# Patient Record
Sex: Female | Born: 1985 | Race: White | Hispanic: No | Marital: Married | Smoking: Never smoker
Health system: Southern US, Community
[De-identification: ages and names within clinical notes are randomized; demographics above are authoritative.]

## PROBLEM LIST (undated history)

## (undated) DIAGNOSIS — G43909 Migraine, unspecified, not intractable, without status migrainosus: Secondary | ICD-10-CM

## (undated) DIAGNOSIS — J45909 Unspecified asthma, uncomplicated: Secondary | ICD-10-CM

## (undated) DIAGNOSIS — D649 Anemia, unspecified: Secondary | ICD-10-CM

## (undated) DIAGNOSIS — H269 Unspecified cataract: Secondary | ICD-10-CM

## (undated) DIAGNOSIS — R87629 Unspecified abnormal cytological findings in specimens from vagina: Secondary | ICD-10-CM

## (undated) DIAGNOSIS — O139 Gestational [pregnancy-induced] hypertension without significant proteinuria, unspecified trimester: Secondary | ICD-10-CM

## (undated) HISTORY — PX: WISDOM TOOTH EXTRACTION: SHX21

## (undated) HISTORY — PX: CHOLECYSTECTOMY: SHX55

## (undated) HISTORY — DX: Unspecified abnormal cytological findings in specimens from vagina: R87.629

## (undated) HISTORY — DX: Unspecified cataract: H26.9

## (undated) HISTORY — DX: Unspecified asthma, uncomplicated: J45.909

## (undated) HISTORY — DX: Gestational (pregnancy-induced) hypertension without significant proteinuria, unspecified trimester: O13.9

## (undated) HISTORY — DX: Anemia, unspecified: D64.9

## (undated) HISTORY — DX: Migraine, unspecified, not intractable, without status migrainosus: G43.909

---
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
## 2007-09-10 ENCOUNTER — Emergency Department: Payer: Self-pay | Admitting: Emergency Medicine

## 2007-11-02 IMAGING — CR DG ANKLE COMPLETE 3+V*L*
1 series · 5 of 5 positions shown · non-contrast
Comparison: none

REASON FOR EXAM: Injury    [REDACTED]
COMMENTS:

PROCEDURE:     DXR - DXR ANKLE LEFT COMPLETE  - April 08, 2006  [DATE]
RESULT:       Five views of the LEFT ankle were obtained.  No fracture,
dislocation or other acute bony abnormality is identified.  The ankle
mortise is well maintained.

[Series 1: view not recorded · 0.17mm/px · 5 of 5 slices shown]
[im 1/5]
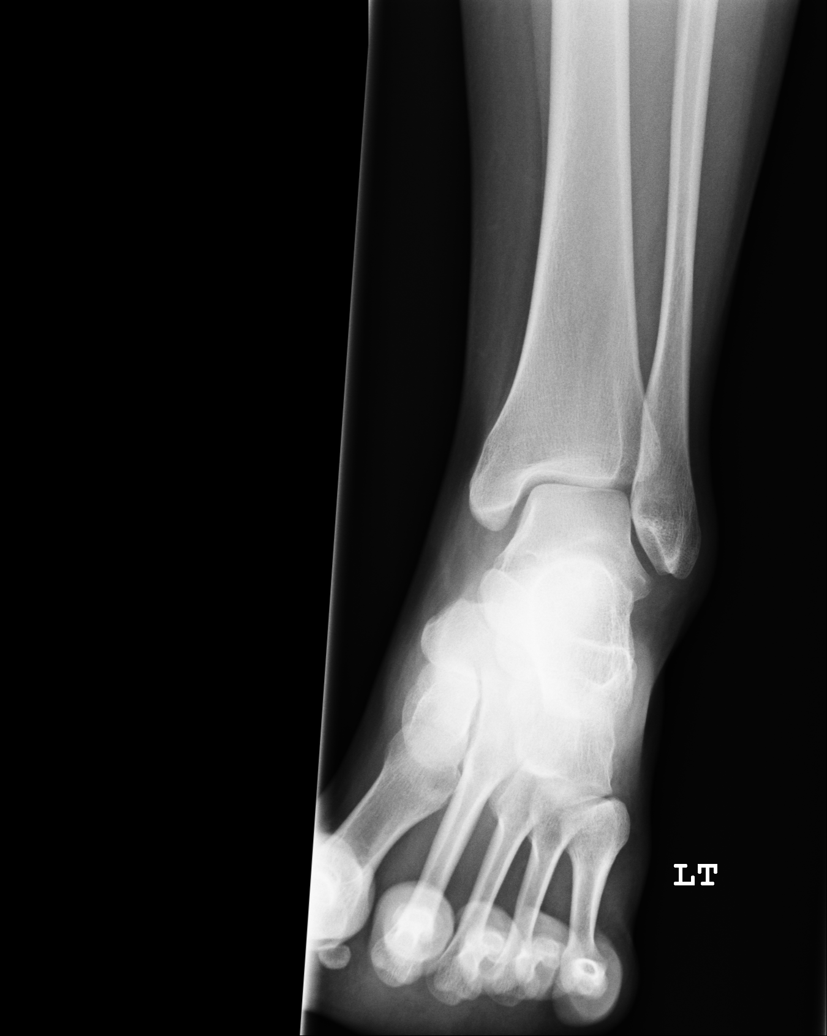
[im 2/5]
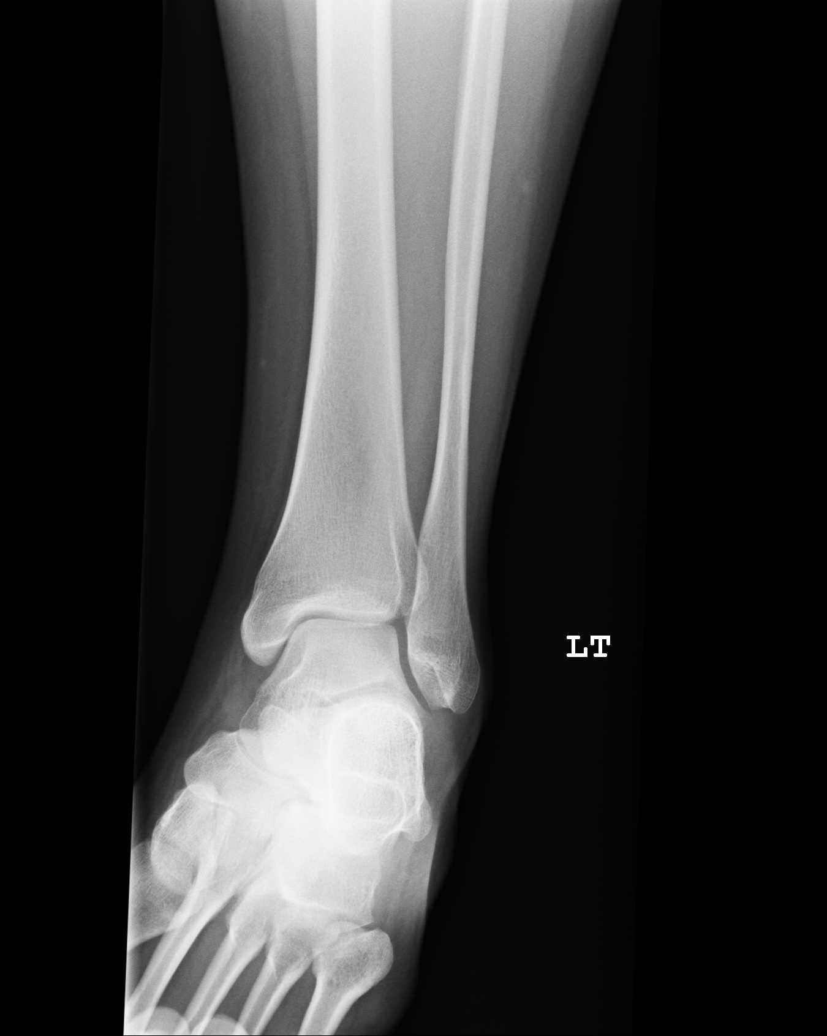
[im 3/5]
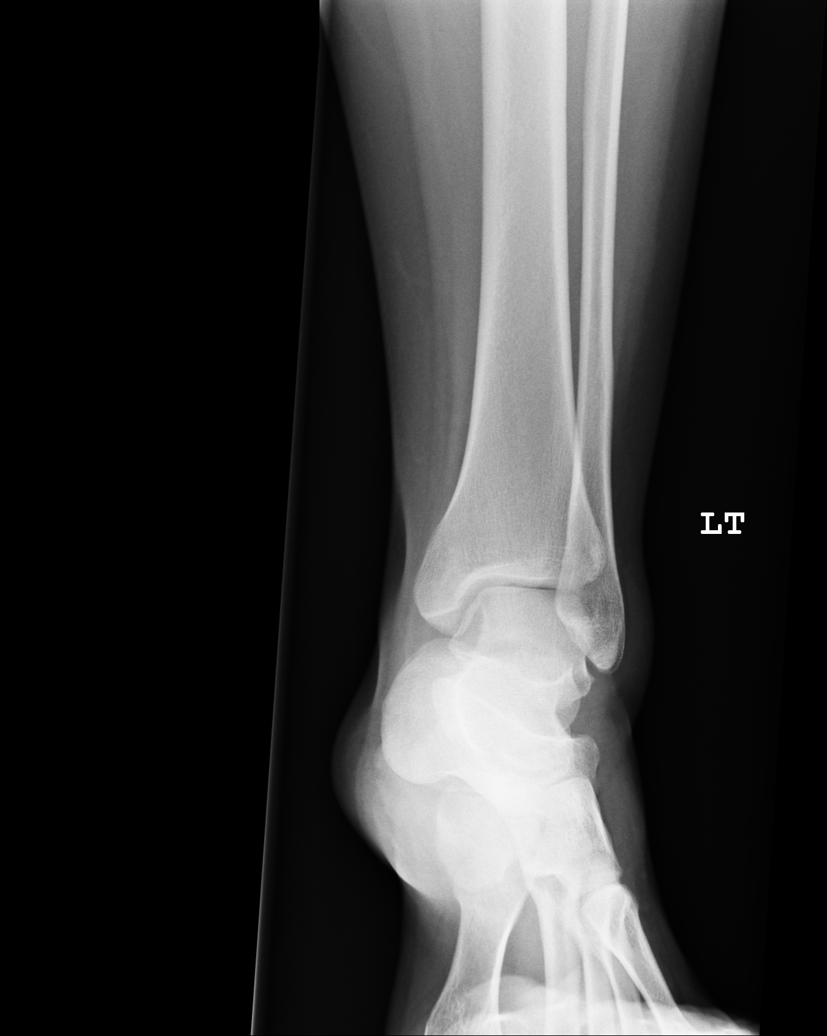
[im 4/5]
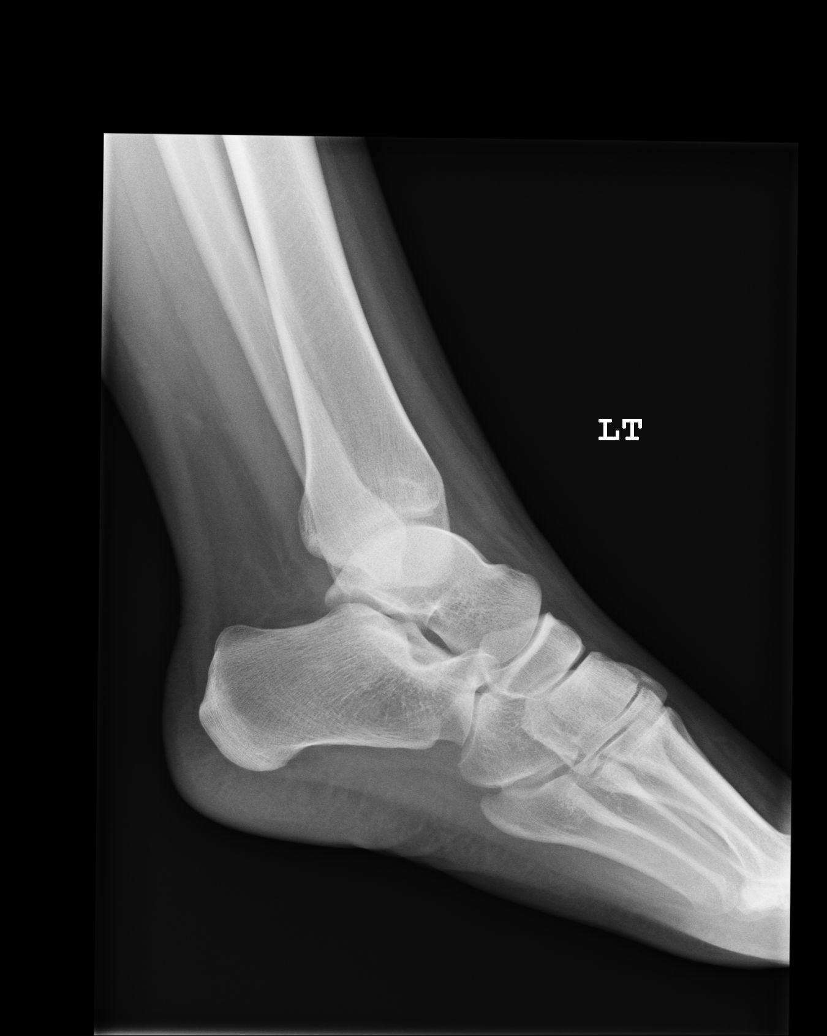
[im 5/5]
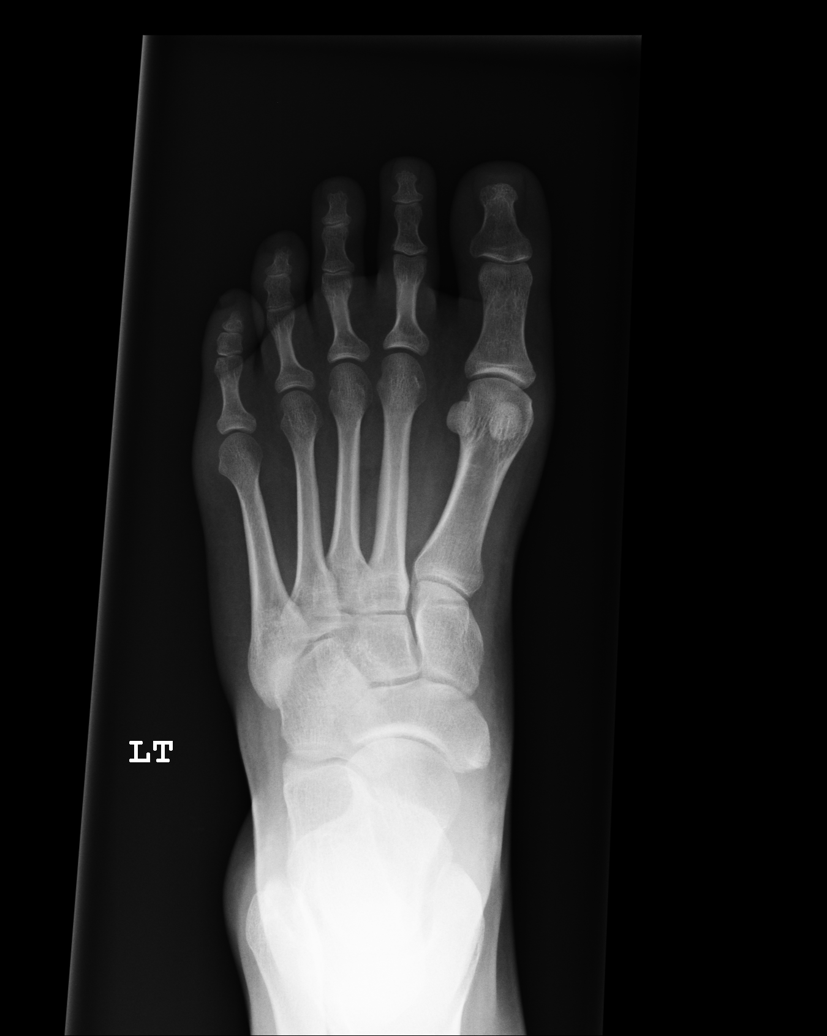

[5 of 5 positions shown; findings below may reference images not displayed]

IMPRESSION: No significant osseous abnormalities are noted.

## 2008-04-13 ENCOUNTER — Emergency Department: Payer: Self-pay | Admitting: Emergency Medicine

## 2008-08-04 ENCOUNTER — Ambulatory Visit: Payer: Self-pay

## 2008-08-28 DIAGNOSIS — R87629 Unspecified abnormal cytological findings in specimens from vagina: Secondary | ICD-10-CM

## 2008-08-28 HISTORY — DX: Unspecified abnormal cytological findings in specimens from vagina: R87.629

## 2009-11-01 ENCOUNTER — Emergency Department: Payer: Self-pay | Admitting: Emergency Medicine

## 2010-09-07 ENCOUNTER — Emergency Department: Payer: Self-pay | Admitting: Emergency Medicine

## 2013-07-02 ENCOUNTER — Emergency Department: Payer: Self-pay | Admitting: Emergency Medicine

## 2013-07-02 LAB — COMPREHENSIVE METABOLIC PANEL
Albumin: 3.7 g/dL (ref 3.4–5.0)
Anion Gap: 2 — ABNORMAL LOW (ref 7–16)
Co2: 30 mmol/L (ref 21–32)
Creatinine: 0.78 mg/dL (ref 0.60–1.30)
EGFR (African American): >60
Glucose: 93 mg/dL (ref 65–99)
Osmolality: 278 (ref 275–301)
SGOT(AST): 18 U/L (ref 15–37)
SGPT (ALT): 15 U/L (ref 12–78)
Sodium: 140 mmol/L (ref 136–145)
Total Protein: 7.2 g/dL (ref 6.4–8.2)

## 2013-07-02 LAB — URINALYSIS, COMPLETE
Bacteria: NONE SEEN
Bilirubin,UR: NEGATIVE
Blood: NEGATIVE
Glucose,UR: NEGATIVE mg/dL (ref 0–75)
Leukocyte Esterase: NEGATIVE
Ph: 5 (ref 4.5–8.0)
Protein: NEGATIVE
RBC,UR: <1 /HPF (ref 0–5)
Specific Gravity: 1.026 (ref 1.003–1.030)

## 2013-07-02 LAB — CBC
MCH: 23.1 pg — ABNORMAL LOW (ref 26.0–34.0)
MCHC: 32.5 g/dL (ref 32.0–36.0)
MCV: 71 fL — ABNORMAL LOW (ref 80–100)
RBC: 4.54 10*6/uL (ref 3.80–5.20)
RDW: 15.6 % — ABNORMAL HIGH (ref 11.5–14.5)

## 2013-11-03 DIAGNOSIS — D509 Iron deficiency anemia, unspecified: Secondary | ICD-10-CM | POA: Insufficient documentation

## 2013-11-17 ENCOUNTER — Emergency Department: Payer: Self-pay | Admitting: Emergency Medicine

## 2014-03-16 DIAGNOSIS — J4520 Mild intermittent asthma, uncomplicated: Secondary | ICD-10-CM | POA: Insufficient documentation

## 2015-10-21 ENCOUNTER — Encounter: Payer: Self-pay | Admitting: Obstetrics and Gynecology

## 2015-10-21 ENCOUNTER — Ambulatory Visit (INDEPENDENT_AMBULATORY_CARE_PROVIDER_SITE_OTHER): Payer: BC Managed Care – PPO | Admitting: Obstetrics and Gynecology

## 2015-10-21 VITALS — BP 115/69 | HR 91 | Ht 66.0 in | Wt 245.6 lb

## 2015-10-21 DIAGNOSIS — Z01419 Encounter for gynecological examination (general) (routine) without abnormal findings: Secondary | ICD-10-CM

## 2015-10-21 DIAGNOSIS — E669 Obesity, unspecified: Secondary | ICD-10-CM

## 2015-10-21 DIAGNOSIS — N926 Irregular menstruation, unspecified: Secondary | ICD-10-CM | POA: Diagnosis not present

## 2015-10-21 NOTE — Patient Instructions (Addendum)
Calorie Counting for Weight Loss Calories are energy you get from the things you eat and drink. Your body uses this energy to keep you going throughout the day. The number of calories you eat affects your weight. When you eat more calories than your body needs, your body stores the extra calories as fat. When you eat fewer calories than your body needs, your body burns fat to get the energy it needs. Calorie counting means keeping track of how many calories you eat and drink each day. If you make sure to eat fewer calories than your body needs, you should lose weight. In order for calorie counting to work, you will need to eat the number of calories that are right for you in a day to lose a healthy amount of weight per week. A healthy amount of weight to lose per week is usually 1-2 lb (0.5-0.9 kg). A dietitian can determine how many calories you need in a day and give you suggestions on how to reach your calorie goal.  WHAT IS MY MY PLAN? My goal is to have __________ calories per day.  If I have this many calories per day, I should lose around __________ pounds per week. WHAT DO I NEED TO KNOW ABOUT CALORIE COUNTING? In order to meet your daily calorie goal, you will need to:  Find out how many calories are in each food you would like to eat. Try to do this before you eat.  Decide how much of the food you can eat.  Write down what you ate and how many calories it had. Doing this is called keeping a food log. WHERE DO I FIND CALORIE INFORMATION? The number of calories in a food can be found on a Nutrition Facts label. Note that all the information on a label is based on a specific serving of the food. If a food does not have a Nutrition Facts label, try to look up the calories online or ask your dietitian for help. HOW DO I DECIDE HOW MUCH TO EAT? To decide how much of the food you can eat, you will need to consider both the number of calories in one serving and the size of one serving. This  information can be found on the Nutrition Facts label. If a food does not have a Nutrition Facts label, look up the information online or ask your dietitian for help. Remember that calories are listed per serving. If you choose to have more than one serving of a food, you will have to multiply the calories per serving by the amount of servings you plan to eat. For example, the label on a package of bread might say that a serving size is 1 slice and that there are 90 calories in a serving. If you eat 1 slice, you will have eaten 90 calories. If you eat 2 slices, you will have eaten 180 calories. HOW DO I KEEP A FOOD LOG? After each meal, record the following information in your food log:  What you ate.  How much of it you ate.  How many calories it had.  Then, add up your calories. Keep your food log near you, such as in a small notebook in your pocket. Another option is to use a mobile app or website. Some programs will calculate calories for you and show you how many calories you have left each time you add an item to the log. WHAT ARE SOME CALORIE COUNTING TIPS?  Use your calories on foods   and drinks that will fill you up and not leave you hungry. Some examples of this include foods like nuts and nut butters, vegetables, lean proteins, and high-fiber foods (more than 5 g fiber per serving).  Eat nutritious foods and avoid empty calories. Empty calories are calories you get from foods or beverages that do not have many nutrients, such as candy and soda. It is better to have a nutritious high-calorie food (such as an avocado) than a food with few nutrients (such as a bag of chips).  Know how many calories are in the foods you eat most often. This way, you do not have to look up how many calories they have each time you eat them.  Look out for foods that may seem like low-calorie foods but are really high-calorie foods, such as baked goods, soda, and fat-free candy.  Pay attention to calories  in drinks. Drinks such as sodas, specialty coffee drinks, alcohol, and juices have a lot of calories yet do not fill you up. Choose low-calorie drinks like water and diet drinks.  Focus your calorie counting efforts on higher calorie items. Logging the calories in a garden salad that contains only vegetables is less important than calculating the calories in a milk shake.  Find a way of tracking calories that works for you. Get creative. Most people who are successful find ways to keep track of how much they eat in a day, even if they do not count every calorie. WHAT ARE SOME PORTION CONTROL TIPS?  Know how many calories are in a serving. This will help you know how many servings of a certain food you can have.  Use a measuring cup to measure serving sizes. This is helpful when you start out. With time, you will be able to estimate serving sizes for some foods.  Take some time to put servings of different foods on your favorite plates, bowls, and cups so you know what a serving looks like.  Try not to eat straight from a bag or box. Doing this can lead to overeating. Put the amount you would like to eat in a cup or on a plate to make sure you are eating the right portion.  Use smaller plates, glasses, and bowls to prevent overeating. This is a quick and easy way to practice portion control. If your plate is smaller, less food can fit on it.  Try not to multitask while eating, such as watching TV or using your computer. If it is time to eat, sit down at a table and enjoy your food. Doing this will help you to start recognizing when you are full. It will also make you more aware of what and how much you are eating. HOW CAN I CALORIE COUNT WHEN EATING OUT?  Ask for smaller portion sizes or child-sized portions.  Consider sharing an entree and sides instead of getting your own entree.  If you get your own entree, eat only half. Ask for a box at the beginning of your meal and put the rest of your  entree in it so you are not tempted to eat it.  Look for the calories on the menu. If calories are listed, choose the lower calorie options.  Choose dishes that include vegetables, fruits, whole grains, low-fat dairy products, and lean protein. Focusing on smart food choices from each of the 5 food groups can help you stay on track at restaurants.  Choose items that are boiled, broiled, grilled, or steamed.  Choose   water, milk, unsweetened iced tea, or other drinks without added sugars. If you want an alcoholic beverage, choose a lower calorie option. For example, a regular margarita can have up to 700 calories and a glass of wine has around 150.  Stay away from items that are buttered, battered, fried, or served with cream sauce. Items labeled "crispy" are usually fried, unless stated otherwise.  Ask for dressings, sauces, and syrups on the side. These are usually very high in calories, so do not eat much of them.  Watch out for salads. Many people think salads are a healthy option, but this is often not the case. Many salads come with bacon, fried chicken, lots of cheese, fried chips, and dressing. All of these items have a lot of calories. If you want a salad, choose a garden salad and ask for grilled meats or steak. Ask for the dressing on the side, or ask for olive oil and vinegar or lemon to use as dressing.  Estimate how many servings of a food you are given. For example, a serving of cooked rice is  cup or about the size of half a tennis ball or one cupcake wrapper. Knowing serving sizes will help you be aware of how much food you are eating at restaurants. The list below tells you how big or small some common portion sizes are based on everyday objects.  1 oz--4 stacked dice.  3 oz--1 deck of cards.  1 tsp--1 dice.  1 Tbsp-- a Ping-Pong ball.  2 Tbsp--1 Ping-Pong ball.   cup--1 tennis ball or 1 cupcake wrapper.  1 cup--1 baseball.   This information is not intended to  replace advice given to you by your health care provider. Make sure you discuss any questions you have with your health care provider.   Document Released: 08/14/2005 Document Revised: 09/04/2014 Document Reviewed: 06/19/2013 Elsevier Interactive Patient Education 2016 [employer] Fat and Cholesterol Restricted Diet High levels of fat and cholesterol in your blood may lead to various health problems, such as diseases of the heart, blood vessels, gallbladder, liver, and pancreas. Fats are concentrated sources of energy that come in various forms. Certain types of fat, including saturated fat, may be harmful in excess. Cholesterol is a substance needed by your body in small amounts. Your body makes all the cholesterol it needs. Excess cholesterol comes from the food you eat. When you have high levels of cholesterol and saturated fat in your blood, health problems can develop because the excess fat and cholesterol will gather along the walls of your blood vessels, causing them to narrow. Choosing the right foods will help you control your intake of fat and cholesterol. This will help keep the levels of these substances in your blood within normal limits and reduce your risk of disease. WHAT IS MY PLAN? Your health care provider recommends that you:  Get no more than __________ % of the total calories in your daily diet from fat.  Limit your intake of saturated fat to less than ______% of your total calories each day.  Limit the amount of cholesterol in your diet to less than _________mg per day. WHAT TYPES OF FAT SHOULD I CHOOSE?  Choose healthy fats more often. Choose monounsaturated and polyunsaturated fats, such as olive and canola oil, flaxseeds, walnuts, almonds, and seeds.  Eat more omega-3 fats. Good choices include salmon, mackerel, sardines, tuna, flaxseed oil, and ground flaxseeds. Aim to eat fish at least two times a week.  Limit saturated fats. Saturated  fats are primarily found in  animal products, such as meats, butter, and cream. Plant sources of saturated fats include palm oil, palm kernel oil, and coconut oil.  Avoid foods with partially hydrogenated oils in them. These contain trans fats. Examples of foods that contain trans fats are stick margarine, some tub margarines, cookies, crackers, and other baked goods. WHAT GENERAL GUIDELINES DO I NEED TO FOLLOW? These guidelines for healthy eating will help you control your intake of fat and cholesterol:  Check food labels carefully to identify foods with trans fats or high amounts of saturated fat.  Fill one half of your plate with vegetables and green salads.  Fill one fourth of your plate with whole grains. Look for the word "whole" as the first word in the ingredient list.  Fill one fourth of your plate with lean protein foods.  Limit fruit to two servings a day. Choose fruit instead of juice.  Eat more foods that contain soluble fiber. Examples of foods that contain this type of fiber are apples, broccoli, carrots, beans, peas, and barley. Aim to get 20-30 g of fiber per day.  Eat more home-cooked food and less restaurant, buffet, and fast food.  Limit or avoid alcohol.  Limit foods high in starch and sugar.  Limit fried foods.  Cook foods using methods other than frying. Baking, boiling, grilling, and broiling are all great options.  Lose weight if you are overweight. Losing just 5-10% of your initial body weight can help your overall health and prevent diseases such as diabetes and heart disease. WHAT FOODS CAN I EAT? Grains Whole grains, such as whole wheat or whole grain breads, crackers, cereals, and pasta. Unsweetened oatmeal, bulgur, barley, quinoa, or brown rice. Corn or whole wheat flour tortillas. Vegetables Fresh or frozen vegetables (raw, steamed, roasted, or grilled). Green salads. Fruits All fresh, canned (in natural juice), or frozen fruits. Meat and Other Protein Products Ground beef  (85% or leaner), grass-fed beef, or beef trimmed of fat. Skinless chicken or [place]. Ground chicken or [place]. Pork trimmed of fat. All fish and seafood. Eggs. Dried beans, peas, or lentils. Unsalted nuts or seeds. Unsalted canned or dry beans. Dairy Low-fat dairy products, such as skim or 1% milk, 2% or reduced-fat cheeses, low-fat ricotta or cottage cheese, or plain low-fat yogurt. Fats and Oils Tub margarines without trans fats. Light or reduced-fat mayonnaise and salad dressings. Avocado. Olive, canola, sesame, or safflower oils. Natural peanut or almond butter (choose ones without added sugar and oil). The items listed above may not be a complete list of recommended foods or beverages. Contact your dietitian for more options. WHAT FOODS ARE NOT RECOMMENDED? Grains White bread. White pasta. White rice. Cornbread. Bagels, pastries, and croissants. Crackers that contain trans fat. Vegetables White potatoes. Corn. Creamed or fried vegetables. Vegetables in a cheese sauce. Fruits Dried fruits. Canned fruit in light or heavy syrup. Fruit juice. Meat and Other Protein Products Fatty cuts of meat. Ribs, chicken wings, bacon, sausage, bologna, salami, chitterlings, fatback, hot dogs, bratwurst, and packaged luncheon meats. Liver and organ meats. Dairy Whole or 2% milk, cream, half-and-half, and cream cheese. Whole milk cheeses. Whole-fat or sweetened yogurt. Full-fat cheeses. Nondairy creamers and whipped toppings. Processed cheese, cheese spreads, or cheese curds. Sweets and Desserts Corn syrup, sugars, honey, and molasses. Candy. Jam and jelly. Syrup. Sweetened cereals. Cookies, pies, cakes, donuts, muffins, and ice cream. Fats and Oils Butter, stick margarine, lard, shortening, ghee, or bacon fat. Coconut, palm kernel, or palm oils.  Beverages Alcohol. Sweetened drinks (such as sodas, lemonade, and fruit drinks or punches). The items listed above may not be a complete list of foods and beverages  to avoid. Contact your dietitian for more information.   This information is not intended to replace advice given to you by your health care provider. Make sure you discuss any questions you have with your health care provider.   Document Released: 08/14/2005 Document Revised: 09/04/2014 Document Reviewed: 11/12/2013 Elsevier Interactive Patient Education [employer]

## 2015-10-21 NOTE — Progress Notes (Signed)
GYNECOLOGY ANNUAL PHYSICAL EXAM PROGRESS NOTE  Subjective:    Deanna Newman is a 30 y.o. G56P1001 female who presents to establish care and for annual exam. Transitioning care from [facility]. The patient has no complaints today. The patient is sexually active. The patient wears seatbelts: yes. The patient participates in regular exercise: no. Has the patient ever been transfused or tattooed?: no. The patient reports that there is not domestic violence in her life. Patient has the following complaints today:  1) Desires to discuss weight loss management.    Gynecologic History Menarche age: 67  Patient's last menstrual period was 09/14/2015. Period Pattern: (!) Irregular Menstrual Flow: Heavy Dysmenorrhea: (!) Moderate Dysmenorrhea Symptoms: Cramping, Nausea, Headache Contraception: none x 3-4 years. History of STI's: denies Last Pap: 2015. Results were: normal.  Notes h/o abnormal pap smear in 2010, followed with colposcopy.    Obstetric History   G1   P1   T1   P0   A0   TAB0   SAB0   E0   M0   L1     # Outcome Date GA Lbr Len/2nd Weight Sex Delivery Anes PTL Lv  1 Term 05/23/12 [redacted]w[redacted]d  8 lb 4 oz (3.742 kg) M CS-LTranv   Y      Past Medical History  Diagnosis Date  . Migraine   . Anemia     Past Surgical History  Procedure Laterality Date  . Wisdom tooth extraction    . Colon surgery  2013    Family History  Problem Relation Age of Onset  . Skin cancer Mother   . GER disease Mother   . Diabetes Father   . COPD Father   . Asthma Father   . Hypertension Father   . Heart failure Father   . Hypertension Sister     Social History   Social History  . Marital Status: Divorced    Spouse Name: N/A  . Number of Children: N/A  . Years of Education: N/A   Occupational History  . Not on file.   Social History Main Topics  . Smoking status: Never Smoker   . Smokeless tobacco: Not on file  . Alcohol Use: Yes     Comment: Occasionally   . Drug Use: No   . Sexual Activity: Yes    Birth Control/ Protection: None     Comment: Seeking pregnancy    Other Topics Concern  . Not on file   Social History Narrative  . No narrative on file    No current outpatient prescriptions on file prior to visit.   No current facility-administered medications on file prior to visit.    Allergies  Allergen Reactions  . Sumatriptan Anaphylaxis      Review of Systems Constitutional: negative for chills, fatigue, fevers and sweats Eyes: negative for irritation, redness and visual disturbance Ears, nose, mouth, throat, and face: negative for hearing loss, nasal congestion, snoring and tinnitus Respiratory: negative for asthma, cough, sputum Cardiovascular: negative for chest pain, dyspnea, exertional chest pressure/discomfort, irregular heart beat, palpitations and syncope Gastrointestinal: negative for abdominal pain, change in bowel habits, nausea and vomiting Genitourinary: positive for irregular menstrual periods, negative for genital lesions, sexual problems and vaginal discharge, dysuria and urinary incontinence Integument/breast: negative for breast lump, breast tenderness and nipple discharge Hematologic/lymphatic: negative for bleeding and easy bruising Musculoskeletal:negative for back pain and muscle weakness Neurological: negative for dizziness, headaches, vertigo and weakness Endocrine: negative for diabetic symptoms including polydipsia, polyuria and  skin dryness Allergic/Immunologic: negative for hay fever and urticaria       Objective:  Blood pressure 115/69, pulse 91, height  (1.676 m), weight 245 lb 9.6 oz (111.403 kg), last menstrual period 09/14/2015. Body mass index is 39.66 kg/(m^2).  General Appearance:    Alert, cooperative, no distress, appears stated age. obese  Head:    Normocephalic, without obvious abnormality, atraumatic  Eyes:    PERRL, conjunctiva/corneas clear, EOM's intact, both eyes  Ears:    Normal  external ear canals, both ears  Nose:   Nares normal, septum midline, mucosa normal, no drainage or sinus tenderness  Throat:   Lips, mucosa, and tongue normal; teeth and gums normal  Neck:   Supple, symmetrical, trachea midline, no adenopathy; thyroid: no enlargement/tenderness/nodules; no carotid bruit or JVD  Back:     Symmetric, no curvature, ROM normal, no CVA tenderness  Lungs:     Clear to auscultation bilaterally, respirations unlabored  Chest Wall:    No tenderness or deformity   Heart:    Regular rate and rhythm, S1 and S2 normal, no murmur, rub or gallop  Breast Exam:    No tenderness, masses, or nipple abnormality  Abdomen:     Soft, non-tender, bowel sounds active all four quadrants, no masses, no organomegaly.    Genitalia:    Pelvic:external genitalia normal, vagina without lesions, discharge, or tenderness, rectovaginal septum  normal. Cervix normal in appearance, no cervical motion tenderness, no adnexal masses or tenderness.  Uterus normal size, shape, mobile, regular contours, nontender.  Rectal:    Normal external sphincter.  No hemorrhoids appreciated. Internal exam not done.   Extremities:   Extremities normal, atraumatic, no cyanosis or edema  Pulses:   2+ and symmetric all extremities  Skin:   Skin color, texture, turgor normal, no rashes or lesions  Lymph nodes:   Cervical, supraclavicular, and axillary nodes normal  Neurologic:   CNII-XII intact, normal strength, sensation and reflexes throughout    Assessment:    Healthy female exam.   Obesity (Class II) Irregular menses   Plan:     All questions answered. Blood tests: CBC with diff, Comprehensive metabolic panel and Glucose. Breast self exam technique reviewed and patient encouraged to perform self-exam monthly. Discussed healthy lifestyle modifications.  Discussed healthy food options, calorie counting.  Patient desires referral to [facility] for weight loss management (notes that her sister has also recently  started the program).  They plan to begin exercising together as well.   Declines STI testing.  Contraception: none.  Patient desires to become pregnant in the near future (i.e. 6 months or so), understands that weight loss may help regulate her cycles and increase her chances of spontaneous pregnancy.  Notes that it took 3 years of active attempts at conception prior to her son being born.  Likely patient with anovulatory syndrome, would require regulation of menstrual cycles and possibly assistance with ovulation induction in the future.  As of now, recommend that patient consider use of contraception if she plans on using weight loss medications. Patient notes understanding, will think about the different forms of contraception (but will likely resume OCPs).  Follow up in 2 weeks with [facility], 1 year for annual exam or sooner once actively trying to conceive   Hildred Laser, MD [facility]

## 2015-10-26 LAB — COMPREHENSIVE METABOLIC PANEL
A/G RATIO: 1.3 (ref 1.1–2.5)
ALT: 13 IU/L (ref 0–32)
AST: 16 IU/L (ref 0–40)
Albumin: 4.1 g/dL (ref 3.5–5.5)
Alkaline Phosphatase: 93 IU/L (ref 39–117)
BUN / CREAT RATIO: 21 — AB (ref 8–20)
BUN: 14 mg/dL (ref 6–20)
Bilirubin Total: <0.2 mg/dL (ref 0.0–1.2)
CHLORIDE: 102 mmol/L (ref 96–106)
CO2: 22 mmol/L (ref 18–29)
Calcium: 9.1 mg/dL (ref 8.7–10.2)
Creatinine, Ser: 0.67 mg/dL (ref 0.57–1.00)
GFR calc non Af Amer: 121 mL/min/{1.73_m2} (ref >59)
GFR, EST AFRICAN AMERICAN: 139 mL/min/{1.73_m2} (ref >59)
Globulin, Total: 3.2 g/dL (ref 1.5–4.5)
Glucose: 71 mg/dL (ref 65–99)
Potassium: 4.5 mmol/L (ref 3.5–5.2)
Sodium: 142 mmol/L (ref 134–144)
TOTAL PROTEIN: 7.3 g/dL (ref 6.0–8.5)

## 2015-10-26 LAB — CBC
Hematocrit: 33.5 % — ABNORMAL LOW (ref 34.0–46.6)
Hemoglobin: 10.7 g/dL — ABNORMAL LOW (ref 11.1–15.9)
MCH: 22.7 pg — AB (ref 26.6–33.0)
MCHC: 31.9 g/dL (ref 31.5–35.7)
MCV: 71 fL — AB (ref 79–97)
Platelets: 364 10*3/uL (ref 150–379)
RBC: 4.72 x10E6/uL (ref 3.77–5.28)
RDW: 15.2 % (ref 12.3–15.4)
WBC: 10.1 10*3/uL (ref 3.4–10.8)

## 2015-10-26 LAB — VITAMIN D 1,25 DIHYDROXY
Vitamin D 1, 25 (OH)2 Total: 31 pg/mL
Vitamin D2 1, 25 (OH)2: <10 pg/mL
Vitamin D3 1, 25 (OH)2: 31 pg/mL

## 2015-10-29 ENCOUNTER — Telehealth: Payer: Self-pay

## 2015-10-29 NOTE — Telephone Encounter (Signed)
-----   Message from Hildred LaserAnika Cherry, MD sent at 10/28/2015 10:45 PM EST ----- Please inform patient that her annual labs are normal except that she is mildly anemic.  Would benefit from a daily multivitamin that contains iron.

## 2015-10-29 NOTE — Telephone Encounter (Signed)
Called pt, no answer, LM informing pt of information below.

## 2015-11-18 ENCOUNTER — Ambulatory Visit: Payer: BC Managed Care – PPO | Admitting: Obstetrics and Gynecology

## 2015-11-26 ENCOUNTER — Telehealth: Payer: Self-pay | Admitting: Obstetrics and Gynecology

## 2015-11-26 NOTE — Telephone Encounter (Signed)
Patient called stating she has not had a cycle in 2 month sand has taken a pregnancy test with a negative result. Dr Valentino Saxonherry does not have any regular open appointments any time soon. If patient needs to be scheduled can you let me know when. Thanks

## 2015-11-26 NOTE — Telephone Encounter (Signed)
Pt has h/o of irregular cycles. LMp 09/14/15- neg hpt on 3/27. Made an appt to dicuss with [facility] on 4/20 2:45

## 2015-12-16 ENCOUNTER — Ambulatory Visit (INDEPENDENT_AMBULATORY_CARE_PROVIDER_SITE_OTHER): Payer: BC Managed Care – PPO | Admitting: Obstetrics and Gynecology

## 2015-12-16 ENCOUNTER — Encounter: Payer: Self-pay | Admitting: Obstetrics and Gynecology

## 2015-12-16 VITALS — BP 111/76 | HR 74 | Ht 66.0 in | Wt 247.8 lb

## 2015-12-16 DIAGNOSIS — N926 Irregular menstruation, unspecified: Secondary | ICD-10-CM

## 2015-12-16 DIAGNOSIS — N970 Female infertility associated with anovulation: Secondary | ICD-10-CM

## 2015-12-16 MED ORDER — MEDROXYPROGESTERONE ACETATE 10 MG PO TABS: 10.0000 mg | ORAL_TABLET | Freq: Every day | ORAL | Status: DC

## 2015-12-16 NOTE — Patient Instructions (Signed)
1. You will begin a medication called Provera.  You will take this medication for 10 days each month.  Begin next dose approximately 30 days from initial dose each month.  2. You will need to keep a menstrual calendar.  Be sure to mark the first day of your cycle each month. 3. Once you have begun having regular cycles, you will need to be sure to have intercourse at least once daily specifically on Days 12-15 of your cycle.  Do not immediately get up after intercourse, remain lying down for 30 minutes.

## 2015-12-18 ENCOUNTER — Encounter: Payer: Self-pay | Admitting: Obstetrics and Gynecology

## 2015-12-18 DIAGNOSIS — R6889 Other general symptoms and signs: Secondary | ICD-10-CM | POA: Insufficient documentation

## 2015-12-18 NOTE — Progress Notes (Signed)
    GYNECOLOGY PROGRESS NOTE  Subjective:    Patient ID: Deanna Newman, female    DOB: 02/16/1986, 30 y.o.   MRN: [id number]  HPI  Patient is a 30 y.o. 461P1001 female who presents for discussion of trying to conceive.  Patient previously was planning on initiating a weight loss program with use of medications, and was instructed to use contraception until off of the medications.  Patient now desires to forego weight loss meds and actively attempt conception.  Notes that it took approximately 3 years to conceive her last child.  Did not try any interventions, or menstrual calendars. Has a h/o irregular menses. Patient's last menstrual period was 09/14/2015.  Has taken a pregnancy test last month, which was negative.    The following portions of the patient's history were reviewed and updated as appropriate: allergies, current medications, past family history, past medical history, past social history, past surgical history and problem list.  Review of Systems Pertinent items noted in HPI and remainder of comprehensive ROS otherwise negative.   Objective:   Blood pressure 111/76, pulse 74, height 5\' 6"  (1.676 m), weight 247 lb 12.8 oz (112.401 kg), last menstrual period 09/14/2015. General appearance: alert and no distress Exam deferred.     Assessment:   H/o infertility  Plan:   Patient with h/o infertility (3 years of active trying prior to conception of last child).  She has a history of irregular menstrual periods.   Patient also advised about LH/ovulation predictor kits, menstrual calendar, also advised to have timed intercourse at least every day or every other day around the time of ovulation.  Will prescribe Provera tablets for cycle induction.  Advised to take a repeat pregnancy test prior to initiation of Provera tablets (patient unable to void during visit).   Will check and follow up on infertility workup labs, next step will be to do semen analysis then HSG.  Will continue to  follow.  Discussed possibility of need for ovulation induction meds (i.e. Clomid) in the future.   RTC in 3 months.   A total of 15 minutes were spent face-to-face with the patient during this encounter and over half of that time dealt with counseling and coordination of care.    A total of 15 minutes were spent face-to-face with the patient during this encounter and over half of that time dealt with counseling and coordination of care.  Hildred LaserAnika Corderro Koloski, MD [facility]

## 2015-12-20 LAB — POCT URINE PREGNANCY: Preg Test, Ur: NEGATIVE

## 2015-12-23 ENCOUNTER — Other Ambulatory Visit: Payer: Self-pay

## 2015-12-23 ENCOUNTER — Telehealth: Payer: Self-pay | Admitting: *Deleted

## 2015-12-23 DIAGNOSIS — Z319 Encounter for procreative management, unspecified: Secondary | ICD-10-CM

## 2015-12-23 NOTE — Telephone Encounter (Signed)
Called pt she states she wants to know the results of her pregnancy test before starting provera tablets. Informed pt that her test was negative. Also informed pt to take provera as prescribed. Also informed pt of the need for day 3 and day 21 labs. Pt gave verbal understanding.

## 2015-12-23 NOTE — Telephone Encounter (Signed)
Patient called and wants to know her lab results . She is requesting a call back. Her call back number is [phone].  Patient states that if she don't answer her phone you can leave the results on her answering machine. Thanks

## 2016-01-12 ENCOUNTER — Other Ambulatory Visit: Payer: BC Managed Care – PPO

## 2016-01-13 LAB — PROLACTIN: Prolactin: 8.2 ng/mL (ref 4.8–23.3)

## 2016-01-13 LAB — PROGESTERONE: PROGESTERONE: 0.1 ng/mL

## 2016-01-13 LAB — TSH: TSH: 1.17 u[IU]/mL (ref 0.450–4.500)

## 2016-01-13 LAB — ESTRADIOL: ESTRADIOL: 77.4 pg/mL

## 2016-01-13 LAB — FSH/LH
FSH: 6 m[IU]/mL
LH: 19.1 m[IU]/mL

## 2016-01-13 LAB — DHEA-SULFATE: DHEA-SO4: 212.6 ug/dL (ref 84.8–378.0)

## 2016-01-31 ENCOUNTER — Other Ambulatory Visit: Payer: Self-pay | Admitting: Obstetrics and Gynecology

## 2016-01-31 ENCOUNTER — Other Ambulatory Visit: Payer: Self-pay

## 2016-01-31 DIAGNOSIS — N979 Female infertility, unspecified: Secondary | ICD-10-CM

## 2016-02-01 LAB — PROGESTERONE: PROGESTERONE: 0.1 ng/mL

## 2016-03-12 ENCOUNTER — Encounter: Payer: Self-pay | Admitting: Emergency Medicine

## 2016-03-12 ENCOUNTER — Emergency Department
Admission: EM | Admit: 2016-03-12 | Discharge: 2016-03-12 | Disposition: A | Discharge status: Home / Self Care | Payer: No Typology Code available for payment source | Attending: Emergency Medicine | Admitting: Emergency Medicine

## 2016-03-12 DIAGNOSIS — S8012XA Contusion of left lower leg, initial encounter: Secondary | ICD-10-CM | POA: Diagnosis not present

## 2016-03-12 DIAGNOSIS — Y9389 Activity, other specified: Secondary | ICD-10-CM | POA: Insufficient documentation

## 2016-03-12 DIAGNOSIS — Y999 Unspecified external cause status: Secondary | ICD-10-CM | POA: Diagnosis not present

## 2016-03-12 DIAGNOSIS — S0083XA Contusion of other part of head, initial encounter: Secondary | ICD-10-CM | POA: Diagnosis not present

## 2016-03-12 DIAGNOSIS — Y92410 Unspecified street and highway as the place of occurrence of the external cause: Secondary | ICD-10-CM | POA: Insufficient documentation

## 2016-03-12 DIAGNOSIS — Z79899 Other long term (current) drug therapy: Secondary | ICD-10-CM | POA: Diagnosis not present

## 2016-03-12 DIAGNOSIS — T148XXA Other injury of unspecified body region, initial encounter: Secondary | ICD-10-CM

## 2016-03-12 DIAGNOSIS — S8992XA Unspecified injury of left lower leg, initial encounter: Secondary | ICD-10-CM | POA: Diagnosis present

## 2016-03-12 MED ORDER — IBUPROFEN 800 MG PO TABS
800.0000 mg | ORAL_TABLET | Freq: Once | ORAL | Status: AC
  Administered 2016-03-12: 800 mg via ORAL
  Filled 2016-03-12: qty 1

## 2016-03-12 MED ORDER — IBUPROFEN 800 MG PO TABS: 800.0000 mg | ORAL_TABLET | Freq: Three times a day (TID) | ORAL | Status: DC | PRN
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
## 2016-03-16 ENCOUNTER — Encounter: Payer: Self-pay | Admitting: Obstetrics and Gynecology

## 2016-03-16 ENCOUNTER — Ambulatory Visit (INDEPENDENT_AMBULATORY_CARE_PROVIDER_SITE_OTHER): Payer: BC Managed Care – PPO | Admitting: Obstetrics and Gynecology

## 2016-03-16 VITALS — BP 115/83 | HR 85 | Ht 66.0 in | Wt 235.9 lb

## 2016-03-16 DIAGNOSIS — Z319 Encounter for procreative management, unspecified: Secondary | ICD-10-CM

## 2016-03-16 DIAGNOSIS — N912 Amenorrhea, unspecified: Secondary | ICD-10-CM | POA: Diagnosis not present

## 2016-03-20 NOTE — Progress Notes (Signed)
    GYNECOLOGY CLINIC PROGRESS NOTE  Subjective:    Patient ID: Deanna Newman, female    DOB: 1985-12-17, 30 y.o.   MRN: [id number]  HPI  Patient is a 30 y.o. 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  Patient reports starting Provera in May (5/10//17) and had withdrawal bleeding ~ 2-3 days after stopping medication (from 5/21-5/30).  Then notes having another episode of bleeding beginning 02/01/2016 that lasted for 4-5 days, like another period.  Has also had a spontaneous cycle in July (7/3-7/9).  Did not f/u for progesterone level in June as she was unsure if the episode of bleeding in June was a period, as she had just had bleeding 2 weeks prior.     The following portions of the patient's history were reviewed and updated as appropriate: allergies, current medications, past family history, past medical history, past social history, past surgical history and problem list.  Review of Systems A comprehensive review of systems was negative except for: side effects from medication, including hot flushes, mood changes   Objective:   Blood pressure 115/83, pulse 85, height 5\' 6"  (1.676 m), weight 235 lb 14.4 oz (107 kg), last menstrual period 02/28/2016. General appearance: alert and no distress Exam deferred.    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  Also had a spontaneous cycle in July (and possibly June).  Will be due for Day #21 progesterone level around 03/20/16.  Will continue to monitor progesterone levels monthly until patient has had 3 consecutive cycles.  Continue to encouraged intercourse around anticipated ovulation dates.  - To f/u in 3 months.  Will notify by phone what progesterone levels are.    A total of 15 minutes were spent face-to-face with the patient during this encounter and over half of that time dealt with counseling and coordination of care.   Hildred Laser, MD [facility]

## 2016-03-20 NOTE — Patient Instructions (Signed)
To return on 03/20/16 for progesterone level.

## 2016-03-27 ENCOUNTER — Encounter: Payer: Self-pay | Admitting: Family Medicine

## 2016-03-27 ENCOUNTER — Telehealth: Payer: Self-pay

## 2016-03-27 DIAGNOSIS — N970 Female infertility associated with anovulation: Secondary | ICD-10-CM

## 2016-03-27 MED ORDER — CLOMIPHENE CITRATE 50 MG PO TABS: 50.0000 mg | ORAL_TABLET | Freq: Every day | ORAL | 3 refills | Status: DC

## 2016-03-27 NOTE — Telephone Encounter (Signed)
Called pt informed her of lab results and the need for clomind (per. Dr.Cherry) see scanned in lab. Pt gave verbal understanding. RX sent in.

## 2016-03-28 ENCOUNTER — Telehealth: Payer: Self-pay | Admitting: Obstetrics and Gynecology

## 2016-03-28 NOTE — Telephone Encounter (Signed)
Yes, she will need to continue her Day #21 progesterone levels each month to see if she is ovulating with the medication.

## 2016-03-28 NOTE — Telephone Encounter (Signed)
This pt called and scheduled a 3 month appt for fu medication.Marland Kitchen She wants to know if she needs labs at any point during these 3 months. She is scheduled for 11/28.

## 2016-04-13 ENCOUNTER — Encounter: Payer: Self-pay | Admitting: Obstetrics and Gynecology

## 2016-07-25 ENCOUNTER — Ambulatory Visit: Payer: BC Managed Care – PPO | Admitting: Obstetrics and Gynecology

## 2016-10-24 ENCOUNTER — Encounter: Payer: Self-pay | Admitting: Obstetrics and Gynecology

## 2016-10-24 ENCOUNTER — Ambulatory Visit (INDEPENDENT_AMBULATORY_CARE_PROVIDER_SITE_OTHER): Payer: BC Managed Care – PPO | Admitting: Obstetrics and Gynecology

## 2016-10-24 VITALS — BP 113/73 | HR 87 | Ht 66.0 in | Wt 256.9 lb

## 2016-10-24 DIAGNOSIS — Z124 Encounter for screening for malignant neoplasm of cervix: Secondary | ICD-10-CM | POA: Diagnosis not present

## 2016-10-24 DIAGNOSIS — K625 Hemorrhage of anus and rectum: Secondary | ICD-10-CM

## 2016-10-24 DIAGNOSIS — Z113 Encounter for screening for infections with a predominantly sexual mode of transmission: Secondary | ICD-10-CM

## 2016-10-24 DIAGNOSIS — Z01419 Encounter for gynecological examination (general) (routine) without abnormal findings: Secondary | ICD-10-CM | POA: Diagnosis not present

## 2016-10-24 DIAGNOSIS — N979 Female infertility, unspecified: Secondary | ICD-10-CM | POA: Diagnosis not present

## 2016-10-24 DIAGNOSIS — N926 Irregular menstruation, unspecified: Secondary | ICD-10-CM

## 2016-10-24 MED ORDER — HYDROCORTISONE ACE-PRAMOXINE 1-1 % RE CREA
1.0000 | TOPICAL_CREAM | Freq: Two times a day (BID) | RECTAL | 0 refills | Status: DC
Start: 2016-10-24 — End: 2016-11-13

## 2016-10-24 NOTE — Patient Instructions (Signed)
Health Maintenance, Female Adopting a healthy lifestyle and getting preventive care can go a long way to promote health and wellness. Talk with your health care provider about what schedule of regular examinations is right for you. This is a good chance for you to check in with your provider about disease prevention and staying healthy. In between checkups, there are plenty of things you can do on your own. Experts have done a lot of research about which lifestyle changes and preventive measures are most likely to keep you healthy. Ask your health care provider for more information. Weight and diet Eat a healthy diet  Be sure to include plenty of vegetables, fruits, low-fat dairy products, and lean protein.  Do not eat a lot of foods high in solid fats, added sugars, or salt.  Get regular exercise. This is one of the most important things you can do for your health.  Most adults should exercise for at least 150 minutes each week. The exercise should increase your heart rate and make you sweat (moderate-intensity exercise).  Most adults should also do strengthening exercises at least twice a week. This is in addition to the moderate-intensity exercise. Maintain a healthy weight  Body mass index (BMI) is a measurement that can be used to identify possible weight problems. It estimates body fat based on height and weight. Your health care provider can help determine your BMI and help you achieve or maintain a healthy weight.  For females 76 years of age and older:  A BMI below 18.5 is considered underweight.  A BMI of 18.5 to 24.9 is normal.  A BMI of 25 to 29.9 is considered overweight.  A BMI of 30 and above is considered obese. Watch levels of cholesterol and blood lipids  You should start having your blood tested for lipids and cholesterol at 29 years of age, then have this test every 5 years.  You may need to have your cholesterol levels checked more often if:  Your lipid or  cholesterol levels are high.  You are older than 29 years of age.  You are at high risk for heart disease. Cancer screening Lung Cancer  Lung cancer screening is recommended for adults 64-42 years old who are at high risk for lung cancer because of a history of smoking.  A yearly low-dose CT scan of the lungs is recommended for people who:  Currently smoke.  Have quit within the past 15 years.  Have at least a 30-pack-year history of smoking. A pack year is smoking an average of one pack of cigarettes a day for 1 year.  Yearly screening should continue until it has been 15 years since you quit.  Yearly screening should stop if you develop a health problem that would prevent you from having lung cancer treatment. Breast Cancer  Practice breast self-awareness. This means understanding how your breasts normally appear and feel.  It also means doing regular breast self-exams. Let your health care provider know about any changes, no matter how small.  If you are in your 20s or 30s, you should have a clinical breast exam (CBE) by a health care provider every 1-3 years as part of a regular health exam.  If you are 34 or older, have a CBE every year. Also consider having a breast X-ray (mammogram) every year.  If you have a family history of breast cancer, talk to your health care provider about genetic screening.  If you are at high risk for breast cancer, talk  to your health care provider about having an MRI and a mammogram every year.  Breast cancer gene (BRCA) assessment is recommended for women who have family members with BRCA-related cancers. BRCA-related cancers include:  Breast.  Ovarian.  Tubal.  Peritoneal cancers.  Results of the assessment will determine the need for genetic counseling and BRCA1 and BRCA2 testing. Cervical Cancer  Your health care provider may recommend that you be screened regularly for cancer of the pelvic organs (ovaries, uterus, and vagina).  This screening involves a pelvic examination, including checking for microscopic changes to the surface of your cervix (Pap test). You may be encouraged to have this screening done every 3 years, beginning at age 24.  For women ages 66-65, health care providers may recommend pelvic exams and Pap testing every 3 years, or they may recommend the Pap and pelvic exam, combined with testing for human papilloma virus (HPV), every 5 years. Some types of HPV increase your risk of cervical cancer. Testing for HPV may also be done on women of any age with unclear Pap test results.  Other health care providers may not recommend any screening for nonpregnant women who are considered low risk for pelvic cancer and who do not have symptoms. Ask your health care provider if a screening pelvic exam is right for you.  If you have had past treatment for cervical cancer or a condition that could lead to cancer, you need Pap tests and screening for cancer for at least 20 years after your treatment. If Pap tests have been discontinued, your risk factors (such as having a new sexual partner) need to be reassessed to determine if screening should resume. Some women have medical problems that increase the chance of getting cervical cancer. In these cases, your health care provider may recommend more frequent screening and Pap tests. Colorectal Cancer  This type of cancer can be detected and often prevented.  Routine colorectal cancer screening usually begins at 29 years of age and continues through 29 years of age.  Your health care provider may recommend screening at an earlier age if you have risk factors for colon cancer.  Your health care provider may also recommend using home test kits to check for hidden blood in the stool.  A small camera at the end of a tube can be used to examine your colon directly (sigmoidoscopy or colonoscopy). This is done to check for the earliest forms of colorectal cancer.  Routine  screening usually begins at age 41.  Direct examination of the colon should be repeated every 5-10 years through 29 years of age. However, you may need to be screened more often if early forms of precancerous polyps or small growths are found. Skin Cancer  Check your skin from head to toe regularly.  Tell your health care provider about any new moles or changes in moles, especially if there is a change in a mole's shape or color.  Also tell your health care provider if you have a mole that is larger than the size of a pencil eraser.  Always use sunscreen. Apply sunscreen liberally and repeatedly throughout the day.  Protect yourself by wearing long sleeves, pants, a wide-brimmed hat, and sunglasses whenever you are outside. Heart disease, diabetes, and high blood pressure  High blood pressure causes heart disease and increases the risk of stroke. High blood pressure is more likely to develop in:  People who have blood pressure in the high end of the normal range (130-139/85-89 mm Hg).  People who are overweight or obese.  People who are African American.  If you are 59-24 years of age, have your blood pressure checked every 3-5 years. If you are 34 years of age or older, have your blood pressure checked every year. You should have your blood pressure measured twice-once when you are at a hospital or clinic, and once when you are not at a hospital or clinic. Record the average of the two measurements. To check your blood pressure when you are not at a hospital or clinic, you can use:  An automated blood pressure machine at a pharmacy.  A home blood pressure monitor.  If you are between 29 years and 60 years old, ask your health care provider if you should take aspirin to prevent strokes.  Have regular diabetes screenings. This involves taking a blood sample to check your fasting blood sugar level.  If you are at a normal weight and have a low risk for diabetes, have this test once  every three years after 29 years of age.  If you are overweight and have a high risk for diabetes, consider being tested at a younger age or more often. Preventing infection Hepatitis B  If you have a higher risk for hepatitis B, you should be screened for this virus. You are considered at high risk for hepatitis B if:  You were born in a country where hepatitis B is common. Ask your health care provider which countries are considered high risk.  Your parents were born in a high-risk country, and you have not been immunized against hepatitis B (hepatitis B vaccine).  You have HIV or AIDS.  You use needles to inject street drugs.  You live with someone who has hepatitis B.  You have had sex with someone who has hepatitis B.  You get hemodialysis treatment.  You take certain medicines for conditions, including cancer, organ transplantation, and autoimmune conditions. Hepatitis C  Blood testing is recommended for:  Everyone born from [birth year] through 1963.  Anyone with known risk factors for hepatitis C. Sexually transmitted infections (STIs)  You should be screened for sexually transmitted infections (STIs) including gonorrhea and chlamydia if:  You are sexually active and are younger than 29 years of age.  You are older than 29 years of age and your health care provider tells you that you are at risk for this type of infection.  Your sexual activity has changed since you were last screened and you are at an increased risk for chlamydia or gonorrhea. Ask your health care provider if you are at risk.  If you do not have HIV, but are at risk, it may be recommended that you take a prescription medicine daily to prevent HIV infection. This is called pre-exposure prophylaxis (PrEP). You are considered at risk if:  You are sexually active and do not regularly use condoms or know the HIV status of your partner(s).  You take drugs by injection.  You are sexually active with a partner  who has HIV. Talk with your health care provider about whether you are at high risk of being infected with HIV. If you choose to begin PrEP, you should first be tested for HIV. You should then be tested every 3 months for as long as you are taking PrEP. Pregnancy  If you are premenopausal and you may become pregnant, ask your health care provider about preconception counseling.  If you may become pregnant, take 400 to 800 micrograms (mcg) of folic acid  every day.  If you want to prevent pregnancy, talk to your health care provider about birth control (contraception). Osteoporosis and menopause  Osteoporosis is a disease in which the bones lose minerals and strength with aging. This can result in serious bone fractures. Your risk for osteoporosis can be identified using a bone density scan.  If you are 4 years of age or older, or if you are at risk for osteoporosis and fractures, ask your health care provider if you should be screened.  Ask your health care provider whether you should take a calcium or vitamin D supplement to lower your risk for osteoporosis.  Menopause may have certain physical symptoms and risks.  Hormone replacement therapy may reduce some of these symptoms and risks. Talk to your health care provider about whether hormone replacement therapy is right for you. Follow these instructions at home:  Schedule regular health, dental, and eye exams.  Stay current with your immunizations.  Do not use any tobacco products including cigarettes, chewing tobacco, or electronic cigarettes.  If you are pregnant, do not drink alcohol.  If you are breastfeeding, limit how much and how often you drink alcohol.  Limit alcohol intake to no more than 1 drink per day for nonpregnant women. One drink equals 12 ounces of beer, 5 ounces of wine, or 1 ounces of hard liquor.  Do not use street drugs.  Do not share needles.  Ask your health care provider for help if you need support  or information about quitting drugs.  Tell your health care provider if you often feel depressed.  Tell your health care provider if you have ever been abused or do not feel safe at home. This information is not intended to replace advice given to you by your health care provider. Make sure you discuss any questions you have with your health care provider. Document Released: 02/27/2011 Document Revised: 01/20/2016 Document Reviewed: 05/18/2015 Elsevier Interactive Patient Education  2017 [employer].

## 2016-10-24 NOTE — Progress Notes (Signed)
GYNECOLOGY ANNUAL PHYSICAL EXAM PROGRESS NOTE  Subjective:    Deanna Newman is a 31 y.o. 361P1001 female who presents for annual exam. The patient has no complaints today. The patient is sexually active. The patient wears seatbelts: yes. The patient participates in regular exercise: no. Has the patient ever been transfused or tattooed?: no. The patient reports that there is not domestic violence in her life.   1.  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  Notes last 2 cycles have been spontaneous.  Has been losing weight. Took Clomid x 1 several months ago but could not tolerate side effects.   Gynecologic History Menarche age: [age]  Patient's last menstrual period was 09/27/2016. Period Pattern: (!) Irregular Menstrual Flow: Heavy Dysmenorrhea: (!) Moderate Dysmenorrhea Symptoms: Cramping Contraception: none.  History of STI's: denies Last Pap: 2015. Results were: normal.  Notes h/o abnormal pap smear in 2010, followed with colposcopy.    Obstetric History   G1   P1   T1   P0   A0   L1    SAB0   TAB0   Ectopic0   Multiple0   Live Births1     # Outcome Date GA Lbr Len/2nd Weight Sex Delivery Anes PTL Lv  1 Term 05/23/12 [id number]  8 lb 4 oz (3.742 kg) M CS-LTranv   LIV      Past Medical History:  Diagnosis Date  . Anemia   . Migraine   . Vaginal Pap smear, abnormal 2010   followed by colposcopy    Past Surgical History:  Procedure Laterality Date  . CESAREAN SECTION    . WISDOM TOOTH EXTRACTION      Family History  Problem Relation Age of Onset  . Skin cancer Mother   . GER disease Mother   . Diabetes Father   . COPD Father   . Asthma Father   . Hypertension Father   . Heart failure Father   . Hypertension Sister     Social History   Social History  . Marital status: Divorced    Spouse name: N/A  . Number of children: N/A  . Years of education: N/A   Occupational History  . Not on file.   Social History Main Topics  .  Smoking status: Never Smoker  . Smokeless tobacco: Never Used  . Alcohol use Yes     Comment: Occasionally   . Drug use: No  . Sexual activity: Yes    Birth control/ protection: None     Comment: Seeking pregnancy    Other Topics Concern  . Not on file   Social History Narrative  . No narrative on file    Current Outpatient Prescriptions on File Prior to Visit  Medication Sig Dispense Refill  . ibuprofen (ADVIL,MOTRIN) 800 MG tablet Take 1 tablet (800 mg total) by mouth every 8 (eight) hours as needed for moderate pain. 15 tablet 0  . clomiPHENE (CLOMID) 50 MG tablet Take 1 tablet (50 mg total) by mouth daily. Beginning day 1 of cycle. (Patient not taking: Reported on 10/24/2016) 5 tablet 3  . medroxyPROGESTERone (PROVERA) 10 MG tablet Take 1 tablet (10 mg total) by mouth daily. Use for ten days (Patient not taking: Reported on 03/16/2016) 10 tablet 2   No current facility-administered medications on file prior to visit.     Allergies  Allergen Reactions  . Sumatriptan Anaphylaxis     Review of Systems Constitutional: negative for chills, fatigue, fevers and sweats  Eyes: negative for irritation, redness and visual disturbance Ears, nose, mouth, throat, and face: negative for hearing loss, nasal congestion, snoring and tinnitus Respiratory: negative for asthma, cough, sputum Cardiovascular: negative for chest pain, dyspnea, exertional chest pressure/discomfort, irregular heart beat, palpitations and syncope Gastrointestinal: negative for abdominal pain, change in bowel habits, nausea and vomiting.  Positive for rectal bleeding with bowel movements.   Does report some mild intermittent constipation. Genitourinary: positive for irregular menstrual periods, negative for genital lesions, sexual problems and vaginal discharge, dysuria and urinary incontinence Integument/breast: negative for breast lump, breast tenderness and nipple discharge Hematologic/lymphatic: negative for  bleeding and easy bruising Musculoskeletal:negative for back pain and muscle weakness Neurological: negative for dizziness, headaches, vertigo and weakness Endocrine: negative for diabetic symptoms including polydipsia, polyuria and skin dryness Allergic/Immunologic: negative for hay fever and urticaria       Objective:  Blood pressure 113/73, pulse 87, height 5\' 6"  (1.676 m), weight 256 lb 14.4 oz (116.5 kg), last menstrual period 09/27/2016. Body mass index is 41.46 kg/m.  General Appearance:    Alert, cooperative, no distress, appears stated age. obese  Head:    Normocephalic, without obvious abnormality, atraumatic  Eyes:    PERRL, conjunctiva/corneas clear, EOM's intact, both eyes  Ears:    Normal external ear canals, both ears  Nose:   Nares normal, septum midline, mucosa normal, no drainage or sinus tenderness  Throat:   Lips, mucosa, and tongue normal; teeth and gums normal  Neck:   Supple, symmetrical, trachea midline, no adenopathy; thyroid: no enlargement/tenderness/nodules; no carotid bruit or JVD  Back:     Symmetric, no curvature, ROM normal, no CVA tenderness  Lungs:     Clear to auscultation bilaterally, respirations unlabored  Chest Wall:    No tenderness or deformity   Heart:    Regular rate and rhythm, S1 and S2 normal, no murmur, rub or gallop  Breast Exam:    No tenderness, masses, or nipple abnormality  Abdomen:     Soft, non-tender, bowel sounds active all four quadrants, no masses, no organomegaly.    Genitalia:    Pelvic:external genitalia normal, vagina without lesions, discharge, or tenderness, rectovaginal septum  normal. Cervix normal in appearance, no cervical motion tenderness, no adnexal masses or tenderness.  Uterus normal size, shape, mobile, regular contours, nontender.  Rectal:    Normal external sphincter.  No hemorrhoids appreciated. Internal exam not done.   Extremities:   Extremities normal, atraumatic, no cyanosis or edema  Pulses:   2+ and  symmetric all extremities  Skin:   Skin color, texture, turgor normal, no rashes or lesions  Lymph nodes:   Cervical, supraclavicular, and axillary nodes normal  Neurologic:   CNII-XII intact, normal strength, sensation and reflexes throughout     Labs:  Lab Results  Component Value Date   WBC 10.1 10/21/2015   HGB 10.5 (L) 07/02/2013   HCT 33.5 (L) 10/21/2015   MCV 71 (L) 10/21/2015   PLT 364 10/21/2015    Lab Results  Component Value Date   CREATININE 0.67 10/21/2015   BUN 14 10/21/2015   NA 142 10/21/2015   K 4.5 10/21/2015   CL 102 10/21/2015   CO2 22 10/21/2015    Lab Results  Component Value Date   ALT 13 10/21/2015   AST 16 10/21/2015   ALKPHOS 93 10/21/2015   BILITOT <0.2 10/21/2015    Lab Results  Component Value Date   TSH 1.170 01/12/2016    Assessment:  Healthy female exam.   Cervical cancer screening.  Obesity (Class III) Irregular menses Secondary infertility STD screening Rectal bleeding  Plan:     All questions answered. Pap smear performed today.  Blood tests: CBC with diff, Comprehensive metabolic panel and Glucose. Breast self exam technique reviewed and patient encouraged to perform self-exam monthly. Discussed healthy lifestyle modifications. Continue to encourage weight loss.  Desires STI testing. Contraception: none.  Patient planning to conceive.  Has h/o irregular menses but last 2 cycles have been regular.  Declines further use of Clomid due to side effects.  Offered Femara if no success in 6 months of trying (as patient will have been trying for over 1 year). Can continue use of Provera tablets for withdrawal bleed if cycles become irregular again.  Rectal bleeding may be due to small tear or possible internal hemorrhoid. Discussed increased hydration, fiber intake, sitz baths.  Also will prescribe AAnalpram cream for treatment.   Follow up in 1 year for annual exam.    Hildred Laser, MD [facility]

## 2016-10-24 NOTE — Progress Notes (Deleted)
GYNECOLOGY ANNUAL PHYSICAL EXAM PROGRESS NOTE  Subjective:    Deanna Newman is a 31 y.o. G19P1001 female who presents for an annual exam. The patient has no complaints today. The patient {is/is not/has never been:13135} sexually active. GYN screening history: {gyn screen history:13142}. The patient wears seatbelts: {yes/no:311178}. The patient participates in regular exercise: {yes/no/not asked:9010}. Has the patient ever been transfused or tattooed?: {yes/no/not asked:9010}. The patient reports that there {is/is not:9024} domestic violence in her life.    Gynecologic History Patient's last menstrual period was 09/27/2016. Menstrual History: OB History    Gravida Para Term Preterm AB Living   1 1 1     1    SAB TAB Ectopic Multiple Live Births           1      Menarche age: *** Patient's last menstrual period was 09/27/2016. Period Pattern: (!) Irregular Menstrual Flow: Heavy Dysmenorrhea: (!) Moderate Dysmenorrhea Symptoms: Cramping  Contraception: {method:5051} History of STI's:  Last Pap: ***. Results were: {norm/abn:16337}.  ***Denies/Notes h/o abnormal pap smears. Last mammogram: ***. Results were: {norm/abn:16337}   Obstetric History   G1   P1   T1   P0   A0   L1    SAB0   TAB0   Ectopic0   Multiple0   Live Births1     # Outcome Date GA Lbr Len/2nd Weight Sex Delivery Anes PTL Lv  1 Term 05/23/12 [redacted]w[redacted]d  8 lb 4 oz (3.742 kg) M CS-LTranv   LIV      Past Medical History:  Diagnosis Date  . Anemia   . Migraine   . Vaginal Pap smear, abnormal 2010   followed by colposcopy    Past Surgical History:  Procedure Laterality Date  . CESAREAN SECTION    . WISDOM TOOTH EXTRACTION      Family History  Problem Relation Age of Onset  . Skin cancer Mother   . GER disease Mother   . Diabetes Father   . COPD Father   . Asthma Father   . Hypertension Father   . Heart failure Father   . Hypertension Sister     Social History   Social History  . Marital  status: Divorced    Spouse name: N/A  . Number of children: N/A  . Years of education: N/A   Occupational History  . Not on file.   Social History Main Topics  . Smoking status: Never Smoker  . Smokeless tobacco: Never Used  . Alcohol use Yes     Comment: Occasionally   . Drug use: No  . Sexual activity: Yes    Birth control/ protection: None     Comment: Seeking pregnancy    Other Topics Concern  . Not on file   Social History Narrative  . No narrative on file    Current Outpatient Prescriptions on File Prior to Visit  Medication Sig Dispense Refill  . ibuprofen (ADVIL,MOTRIN) 800 MG tablet Take 1 tablet (800 mg total) by mouth every 8 (eight) hours as needed for moderate pain. 15 tablet 0  . clomiPHENE (CLOMID) 50 MG tablet Take 1 tablet (50 mg total) by mouth daily. Beginning day 1 of cycle. (Patient not taking: Reported on 10/24/2016) 5 tablet 3  . medroxyPROGESTERone (PROVERA) 10 MG tablet Take 1 tablet (10 mg total) by mouth daily. Use for ten days (Patient not taking: Reported on 03/16/2016) 10 tablet 2   No current facility-administered medications on file prior to visit.  Allergies  Allergen Reactions  . Sumatriptan Anaphylaxis      Review of Systems Constitutional: negative for chills, fatigue, fevers and sweats Eyes: negative for irritation, redness and visual disturbance Ears, nose, mouth, throat, and face: negative for hearing loss, nasal congestion, snoring and tinnitus Respiratory: negative for asthma, cough, sputum Cardiovascular: negative for chest pain, dyspnea, exertional chest pressure/discomfort, irregular heart beat, palpitations and syncope Gastrointestinal: negative for abdominal pain, change in bowel habits, nausea and vomiting Genitourinary: negative for abnormal menstrual periods, genital lesions, sexual problems and vaginal discharge, dysuria and urinary incontinence Integument/breast: negative for breast lump, breast tenderness and nipple  discharge Hematologic/lymphatic: negative for bleeding and easy bruising Musculoskeletal:negative for back pain and muscle weakness Neurological: negative for dizziness, headaches, vertigo and weakness Endocrine: negative for diabetic symptoms including polydipsia, polyuria and skin dryness Allergic/Immunologic: negative for hay fever and urticaria        Objective:  Blood pressure 113/73, pulse 87, height 5\' 6"  (1.676 m), weight 256 lb 14.4 oz (116.5 kg), last menstrual period 09/27/2016. Body mass index is 41.46 kg/m.     General Appearance:    Alert, cooperative, no distress, appears stated age  Head:    Normocephalic, without obvious abnormality, atraumatic  Eyes:    PERRL, conjunctiva/corneas clear, EOM's intact, both eyes  Ears:    Normal external ear canals, both ears  Nose:   Nares normal, septum midline, mucosa normal, no drainage or sinus tenderness  Throat:   Lips, mucosa, and tongue normal; teeth and gums normal  Neck:   Supple, symmetrical, trachea midline, no adenopathy; thyroid: no enlargement/tenderness/nodules; no carotid bruit or JVD  Back:     Symmetric, no curvature, ROM normal, no CVA tenderness  Lungs:     Clear to auscultation bilaterally, respirations unlabored  Chest Wall:    No tenderness or deformity   Heart:    Regular rate and rhythm, S1 and S2 normal, no murmur, rub or gallop  Breast Exam:    No tenderness, masses, or nipple abnormality  Abdomen:     Soft, non-tender, bowel sounds active all four quadrants, no masses, no organomegaly.    Genitalia:    Pelvic:external genitalia normal, vagina without lesions, discharge, or tenderness, rectovaginal septum  normal. Cervix normal in appearance, no cervical motion tenderness, no adnexal masses or tenderness.  Uterus normal size, shape, mobile, regular contours, nontender.  Rectal:    Normal external sphincter.  No hemorrhoids appreciated. Internal exam not done.   Extremities:   Extremities normal, atraumatic,  no cyanosis or edema  Pulses:   2+ and symmetric all extremities  Skin:   Skin color, texture, turgor normal, no rashes or lesions  Lymph nodes:   Cervical, supraclavicular, and axillary nodes normal  Neurologic:   CNII-XII intact, normal strength, sensation and reflexes throughout   .  Labs:  Lab Results  Component Value Date   WBC 10.1 10/21/2015   HGB 10.5 (L) 07/02/2013   HCT 33.5 (L) 10/21/2015   MCV 71 (L) 10/21/2015   PLT 364 10/21/2015    Lab Results  Component Value Date   CREATININE 0.67 10/21/2015   BUN 14 10/21/2015   NA 142 10/21/2015   K 4.5 10/21/2015   CL 102 10/21/2015   CO2 22 10/21/2015    Lab Results  Component Value Date   ALT 13 10/21/2015   AST 16 10/21/2015   ALKPHOS 93 10/21/2015   BILITOT <0.2 10/21/2015    Lab Results  Component Value Date   TSH  1.170 01/12/2016     Assessment:    Healthy female exam.    Plan:     {gyn plan:13146}     Debbe Bales[place] Glanton, CMA [facility]

## 2016-10-25 LAB — HIV ANTIBODY (ROUTINE TESTING W REFLEX): HIV Screen 4th Generation wRfx: NONREACTIVE

## 2016-10-25 LAB — COMPREHENSIVE METABOLIC PANEL
A/G RATIO: 1.4 (ref 1.2–2.2)
ALBUMIN: 4.1 g/dL (ref 3.5–5.5)
ALT: 18 IU/L (ref 0–32)
AST: 18 IU/L (ref 0–40)
Alkaline Phosphatase: 86 IU/L (ref 39–117)
BUN / CREAT RATIO: 16 (ref 9–23)
BUN: 10 mg/dL (ref 6–20)
Bilirubin Total: <0.2 mg/dL (ref 0.0–1.2)
CALCIUM: 9.3 mg/dL (ref 8.7–10.2)
CO2: 23 mmol/L (ref 18–29)
CREATININE: 0.63 mg/dL (ref 0.57–1.00)
Chloride: 102 mmol/L (ref 96–106)
GFR, EST AFRICAN AMERICAN: 141 mL/min/{1.73_m2} (ref >59)
GFR, EST NON AFRICAN AMERICAN: 122 mL/min/{1.73_m2} (ref >59)
GLOBULIN, TOTAL: 2.9 g/dL (ref 1.5–4.5)
Glucose: 93 mg/dL (ref 65–99)
POTASSIUM: 4.4 mmol/L (ref 3.5–5.2)
SODIUM: 141 mmol/L (ref 134–144)
TOTAL PROTEIN: 7 g/dL (ref 6.0–8.5)

## 2016-10-25 LAB — VITAMIN D 25 HYDROXY (VIT D DEFICIENCY, FRACTURES): VIT D 25 HYDROXY: 20.1 ng/mL — AB (ref 30.0–100.0)

## 2016-10-25 LAB — LIPID PANEL
CHOL/HDL RATIO: 4.1 ratio (ref 0.0–4.4)
Cholesterol, Total: 161 mg/dL (ref 100–199)
HDL: 39 mg/dL — ABNORMAL LOW (ref >39)
LDL CALC: 97 mg/dL (ref 0–99)
TRIGLYCERIDES: 127 mg/dL (ref 0–149)
VLDL Cholesterol Cal: 25 mg/dL (ref 5–40)

## 2016-10-25 LAB — CBC
Hematocrit: 35.3 % (ref 34.0–46.6)
Hemoglobin: 11.2 g/dL (ref 11.1–15.9)
MCH: 23.5 pg — AB (ref 26.6–33.0)
MCHC: 31.7 g/dL (ref 31.5–35.7)
MCV: 74 fL — ABNORMAL LOW (ref 79–97)
Platelets: 381 10*3/uL — ABNORMAL HIGH (ref 150–379)
RBC: 4.77 x10E6/uL (ref 3.77–5.28)
RDW: 14.9 % (ref 12.3–15.4)
WBC: 8.4 10*3/uL (ref 3.4–10.8)

## 2016-10-25 LAB — RPR: RPR Ser Ql: NONREACTIVE

## 2016-10-25 LAB — HEPATITIS B SURFACE ANTIGEN: Hepatitis B Surface Ag: NEGATIVE

## 2016-10-27 LAB — PAP IG, CT-NG, RFX HPV ASCU
CHLAMYDIA, NUC. ACID AMP: NEGATIVE
Gonococcus by Nucleic Acid Amp: NEGATIVE
PAP SMEAR COMMENT: 0

## 2016-11-11 ENCOUNTER — Encounter: Payer: Self-pay | Admitting: Emergency Medicine

## 2016-11-11 ENCOUNTER — Emergency Department
Admission: EM | Admit: 2016-11-11 | Discharge: 2016-11-11 | Disposition: A | Discharge status: Home / Self Care | Payer: BC Managed Care – PPO | Attending: Emergency Medicine | Admitting: Emergency Medicine

## 2016-11-11 DIAGNOSIS — O4690 Antepartum hemorrhage, unspecified, unspecified trimester: Secondary | ICD-10-CM

## 2016-11-11 DIAGNOSIS — O200 Threatened abortion: Secondary | ICD-10-CM | POA: Insufficient documentation

## 2016-11-11 DIAGNOSIS — Z3A01 Less than 8 weeks gestation of pregnancy: Secondary | ICD-10-CM | POA: Insufficient documentation

## 2016-11-11 DIAGNOSIS — Z791 Long term (current) use of non-steroidal anti-inflammatories (NSAID): Secondary | ICD-10-CM | POA: Diagnosis not present

## 2016-11-11 DIAGNOSIS — O209 Hemorrhage in early pregnancy, unspecified: Secondary | ICD-10-CM | POA: Diagnosis present

## 2016-11-11 LAB — URINALYSIS, COMPLETE (UACMP) WITH MICROSCOPIC
Bacteria, UA: NONE SEEN
Bilirubin Urine: NEGATIVE
GLUCOSE, UA: NEGATIVE mg/dL
KETONES UR: NEGATIVE mg/dL
LEUKOCYTES UA: NEGATIVE
NITRITE: NEGATIVE
PROTEIN: NEGATIVE mg/dL
Specific Gravity, Urine: 1.001 — ABNORMAL LOW (ref 1.005–1.030)
pH: 7 (ref 5.0–8.0)

## 2016-11-11 LAB — HCG, QUANTITATIVE, PREGNANCY: HCG, BETA CHAIN, QUANT, S: 18 m[IU]/mL — AB (ref <5)

## 2016-11-11 NOTE — Discharge Instructions (Signed)
Please seek medical attention for any high fevers, chest pain, shortness of breath, change in behavior, persistent vomiting, bloody stool or any other new or concerning symptoms.  

## 2016-11-11 NOTE — ED Triage Notes (Signed)
Pt to ed with c/o vaginal bleeding after wiping.  Pt states is approx [redacted] weeks pregnant.  Pt reports G2P1

## 2016-11-11 NOTE — ED Provider Notes (Signed)
[facility] Emergency Department Provider Note  ____________________________________________   I have reviewed the triage vital signs and the nursing notes.   HISTORY  Chief Complaint Vaginal Bleeding   History limited by: Not Limited   HPI Deanna Newman is a 31 y.o. female who presents to the emergency department today cousins of concerns for vaginal bleeding in early pregnancy. The patient states she had a positive pregnancy test 4 days ago. By last menstrual cycle she states that she would expect to be about [redacted] weeks pregnant. She started noticing a little bit of bright red bleeding today with wiping. She did not notice any other unusual discharge. Patient denies any abdominal cramping or pain. States she has a history of bleeding in early pregnancy with one of her previous children.   Past Medical History:  Diagnosis Date  . Anemia   . Migraine   . Vaginal Pap smear, abnormal 2010   followed by colposcopy    Patient Active Problem List   Diagnosis Date Noted  . Other general symptoms and signs 12/18/2015  . Obesity (BMI 35.0-39.9 without comorbidity) 10/21/2015  . Irregular menses 10/21/2015  . 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 Anemia, iron deficiency 11/03/2013    Past Surgical History:  Procedure Laterality Date  . CESAREAN SECTION    . WISDOM TOOTH EXTRACTION      Prior to Admission medications   Medication Sig Start Date End Date Taking? Authorizing Provider  ibuprofen (ADVIL,MOTRIN) 800 MG tablet Take 1 tablet (800 mg total) by mouth every 8 (eight) hours as needed for moderate pain. 03/12/16   Irean Hong, MD  medroxyPROGESTERone (PROVERA) 10 MG tablet Take 1 tablet (10 mg total) by mouth daily. Use for ten days Patient not taking: Reported on 03/16/2016 12/16/15   Hildred Laser, MD  pramoxine-hydrocortisone [facility]) 1-1 % rectal cream Place 1 application rectally 2 (two) times daily. 10/24/16   Hildred Laser, MD     Allergies Sumatriptan  Family History  Problem Relation Age of Onset  . Skin cancer Mother   . GER disease Mother   . Diabetes Father   . COPD Father   . Asthma Father   . Hypertension Father   . Heart failure Father   . Hypertension Sister     Social History Social History  Substance Use Topics  . Smoking status: Never Smoker  . Smokeless tobacco: Never Used  . Alcohol use Yes     Comment: Occasionally     Review of Systems  Constitutional: Negative for fever. Cardiovascular: Negative for chest pain. Respiratory: Negative for shortness of breath. Gastrointestinal: Negative for abdominal pain, vomiting and diarrhea. Neurological: Negative for headaches, focal weakness or numbness.  10-point ROS otherwise negative.  ____________________________________________   PHYSICAL EXAM:  VITAL SIGNS: ED Triage Vitals [11/11/16 1220]  Enc Vitals Group     BP      Pulse Rate 99     Resp 20     Temp 98.6 F (37 C)     Temp Source Oral     SpO2 100 %     Weight 256 lb (116.1 kg)     Height      Head Circumference      Peak Flow      Pain Score 0   Constitutional: Alert and oriented. Well appearing and in no distress. Eyes: Conjunctivae are normal. Normal extraocular movements. ENT   Head: Normocephalic and atraumatic.   Nose: No congestion/rhinnorhea.   Mouth/Throat: Mucous membranes  are moist.   Neck: No stridor. Hematological/Lymphatic/Immunilogical: No cervical lymphadenopathy. Cardiovascular: Normal rate, regular rhythm.  No murmurs, rubs, or gallops.  Respiratory: Normal respiratory effort without tachypnea nor retractions. Breath sounds are clear and equal bilaterally. No wheezes/rales/rhonchi. Gastrointestinal: Soft and non tender. No rebound. No guarding.  Genitourinary: Deferred Musculoskeletal: Normal range of motion in all extremities. No lower extremity edema. Neurologic:  Normal speech and language. No gross focal neurologic deficits  are appreciated.  Skin:  Skin is warm, dry and intact. No rash noted. Psychiatric: Mood and affect are normal. Speech and behavior are normal. Patient exhibits appropriate insight and judgment.  ____________________________________________    LABS (pertinent positives/negatives)  Labs Reviewed  HCG, QUANTITATIVE, PREGNANCY - Abnormal; Notable for the following:       Result Value   hCG, Beta Chain, Quant, S 18 (*)    All other components within normal limits  URINALYSIS, COMPLETE (UACMP) WITH MICROSCOPIC - Abnormal; Notable for the following:    Color, Urine COLORLESS (*)    APPearance CLEAR (*)    Specific Gravity, Urine 1.001 (*)    Hgb urine dipstick LARGE (*)    Squamous Epithelial / LPF 0-5 (*)    All other components within normal limits     ____________________________________________   EKG  None  ____________________________________________    RADIOLOGY  None  ____________________________________________   PROCEDURES  Procedures  ____________________________________________   INITIAL IMPRESSION / ASSESSMENT AND PLAN / ED COURSE  Pertinent labs & imaging results that were available during my care of the patient were reviewed by me and considered in my medical decision making (see chart for details).  Patient presented to the emergency department today with concerns for bleeding in early pregnancy. Beta hCG only ate here in the emergency department. Patient had a positive home pregnancy test 4 days ago. Did have a discussion with patient given very low beta hCG I am concerned that she might have had a miscarriage. Did however discuss that she should continue to follow-up with her OB/GYN doctor for repeat beta hCG levels. Given lack of any abdominal pain, cramping or tenderness this point I do not feel emergent imaging is warranted. Did discuss return precautions with patient.  ____________________________________________   FINAL CLINICAL IMPRESSION(S) /  ED DIAGNOSES  Final diagnoses:  Vaginal bleeding in pregnancy  Threatened miscarriage     Note: This dictation was prepared with Dragon dictation. Any transcriptional errors that result from this process are unintentional     Phineas SemenGraydon Ariele Vidrio, MD 11/11/16 [phone]

## 2016-11-13 ENCOUNTER — Encounter: Payer: Self-pay | Admitting: Obstetrics and Gynecology

## 2016-11-13 ENCOUNTER — Telehealth: Payer: Self-pay

## 2016-11-13 ENCOUNTER — Ambulatory Visit (INDEPENDENT_AMBULATORY_CARE_PROVIDER_SITE_OTHER): Payer: BC Managed Care – PPO | Admitting: Obstetrics and Gynecology

## 2016-11-13 VITALS — BP 129/82 | HR 109 | Ht 66.0 in | Wt 255.1 lb

## 2016-11-13 DIAGNOSIS — O200 Threatened abortion: Secondary | ICD-10-CM

## 2016-11-13 NOTE — Telephone Encounter (Signed)
Spoke with Janel from [facility] STAT Hcg was 4. Informed Dr Logan BoresEvans. Miscarriage complete. Patient was called  with results. She was encouraged to call back with further issues or concerns.

## 2016-11-13 NOTE — Progress Notes (Signed)
HPI:      Deanna Newman is a 31 y.o. G2P1001 who LMP was Patient's last menstrual period was 09/27/2016 (exact date).  Subjective:   She presents today  Early in pregnancy complaining of continued vaginal spotting. She was seen in the emergency department on Saturday for this problem. Her Quant at that time was 18.   Of significant note her last pregnancy had spotting in the first trimester.    Hx: The following portions of the patient's history were reviewed and updated as appropriate:              She  has a past medical history of Anemia; Migraine; and Vaginal Pap smear, abnormal (2010). She  does not have any pertinent problems on file. She  has a past surgical history that includes Wisdom tooth extraction and Cesarean section. Her family history includes Asthma in her father; COPD in her father; Diabetes in her father; GER disease in her mother; Heart failure in her father; Hypertension in her father and sister; Skin cancer in her mother. She  reports that she has never smoked. She has never used smokeless tobacco. She reports that she drinks alcohol. She reports that she does not use drugs. Current Outpatient Prescriptions on File Prior to Visit  Medication Sig Dispense Refill  . ibuprofen (ADVIL,MOTRIN) 800 MG tablet Take 1 tablet (800 mg total) by mouth every 8 (eight) hours as needed for moderate pain. 15 tablet 0   No current facility-administered medications on file prior to visit.          Review of Systems:  Review of Systems  Constitutional: Denied constitutional symptoms, night sweats, recent illness, fatigue, fever, insomnia and weight loss.  Eyes: Denied eye symptoms, eye pain, photophobia, vision change and visual disturbance.  Ears/Nose/Throat/Neck: Denied ear, nose, throat or neck symptoms, hearing loss, nasal discharge, sinus congestion and sore throat.  Cardiovascular: Denied cardiovascular symptoms, arrhythmia, chest pain/pressure, edema, exercise  intolerance, orthopnea and palpitations.  Respiratory: Denied pulmonary symptoms, asthma, pleuritic pain, productive sputum, cough, dyspnea and wheezing.  Gastrointestinal: Denied, gastro-esophageal reflux, melena, nausea and vomiting.  Genitourinary: See HPI for additional information.  Musculoskeletal: Denied musculoskeletal symptoms, stiffness, swelling, muscle weakness and myalgia.  Dermatologic: Denied dermatology symptoms, rash and scar.  Neurologic: Denied neurology symptoms, dizziness, headache, neck pain and syncope.  Psychiatric: Denied psychiatric symptoms, anxiety and depression.  Endocrine: Denied endocrine symptoms including hot flashes and night sweats.   Meds:   Current Outpatient Prescriptions on File Prior to Visit  Medication Sig Dispense Refill  . ibuprofen (ADVIL,MOTRIN) 800 MG tablet Take 1 tablet (800 mg total) by mouth every 8 (eight) hours as needed for moderate pain. 15 tablet 0   No current facility-administered medications on file prior to visit.     Objective:     Vitals:   11/13/16 1001  BP: 129/82  Pulse: (!) 109              Physical examination   Pelvic:   Vulva: Normal appearance.  No lesions.  Vagina: No lesions or abnormalities noted.  Moderate vaginal bleeding   Support: Normal pelvic support.  Urethra No masses tenderness or scarring.  Meatus Normal size without lesions or prolapse.  Cervix: Normal appearance.  No lesions.  Cervix closed   Anus: Normal exam.  No lesions.  Perineum: Normal exam.  No lesions.        Bimanual   Uterus: Normal size.  Non-tender.  Mobile.  AV.  Adnexae: No  masses.  Non-tender to palpation.  Cul-de-sac: Negative for abnormality.   Pelvic exam limited by patient body habitus.  Assessment:    G2P1001 Patient Active Problem List   Diagnosis Date Noted  . Other general symptoms and signs 12/18/2015  . Obesity (BMI 35.0-39.9 without comorbidity) 10/21/2015  . Irregular menses 10/21/2015  . Airway  hyperreactivity 03/16/2014  . Anemia, iron deficiency 11/03/2013     1. Threatened abortion in early pregnancy     Very low Quant with vaginal bleeding. Likely miscarriage.   Plan:            1.  Follow-up quantitative beta hCG today.   2.  SAb I have discussed the possibility of miscarriage with the patient.  I have informed her that vaginal bleeding, cramping or passage of tissue are the most common signs of miscarriage.  Should she have heavy bleeding, pass tissue or have other problems, she has been informed to call the office immediately.  I have advised her to remain at pelvic rest for at least one week after her last episode of bleeding.  If she is currently working, I have instructed her to discontinue until this situation resolves.  Complete versus incomplete miscarriage was discussed and the patient is aware that should she develop heavy bleeding, fever, or persistent crampy pelvic pain she may require a D & E to remove the remaining portion of the miscarried pregnancy.  I advised her to keep me informed should her condition change.   Orders   Meds ordered this encounter  Medications  . Prenatal Vit-Fe Fumarate-FA (PRENATAL MULTIVITAMIN) TABS tablet    Sig: Take 1 tablet by mouth daily at 12 noon.        F/U  Return in about 1 week (around 11/20/2016).  Elonda Husky, M.D. 11/13/2016 11:05 AM

## 2016-11-14 ENCOUNTER — Encounter: Payer: BC Managed Care – PPO | Admitting: Obstetrics and Gynecology

## 2016-11-21 ENCOUNTER — Ambulatory Visit (INDEPENDENT_AMBULATORY_CARE_PROVIDER_SITE_OTHER): Payer: BC Managed Care – PPO | Admitting: Obstetrics and Gynecology

## 2016-11-21 VITALS — BP 127/76 | HR 73 | Ht 66.0 in | Wt 258.1 lb

## 2016-11-21 DIAGNOSIS — O039 Complete or unspecified spontaneous abortion without complication: Secondary | ICD-10-CM | POA: Diagnosis not present

## 2016-11-21 DIAGNOSIS — Z8742 Personal history of other diseases of the female genital tract: Secondary | ICD-10-CM

## 2016-11-21 DIAGNOSIS — E669 Obesity, unspecified: Secondary | ICD-10-CM | POA: Diagnosis not present

## 2016-11-21 MED ORDER — METFORMIN HCL 500 MG PO TABS: ORAL_TABLET | ORAL | 5 refills | Status: DC

## 2016-11-21 NOTE — Progress Notes (Signed)
    GYNECOLOGY PROGRESS NOTE  Subjective:    Patient ID: Deanna Newman, female    DOB: 04/29/1986, 29 y.o.   MRN: [id number]  HPI  Patient is a 31 y.o. 162P1011 female who presents for f/u after suspected miscarriage.  Patient has a h/o infertility, previously on ovulation induction meds but discontinued ~ 2 months ago due to side effects. Was seen in the ER ~ 1 week ago due to complaints of vaginal bleeding after a positive home pregnancy test several days before.  BHCG at time of evaluation was 18.  Patient notes that since last week her bleeding has discontinued, and she has no cramping.    The following portions of the patient's history were reviewed and updated as appropriate: allergies, current medications, past family history, past medical history, past social history, past surgical history and problem list.  Review of Systems Pertinent items noted in HPI and remainder of comprehensive ROS otherwise negative.   Objective:   Blood pressure 127/76, pulse 73, height 5\' 6"  (1.676 m), weight 258 lb 1.6 oz (117.1 kg), last menstrual period 09/27/2016. General appearance: alert and no distress Abdomen: soft, non-tender; bowel sounds normal; no masses,  no organomegaly Pelvic: deferred  Assessment:   SAB H/o secondary infertility  Plan:   Discussed with patient that she should begin a new menstrual within 2-4 weeks after recent miscarriage.  If no cycle occurs after 4 weeks, patient should begin a course of Provera (already has prescription).  Advised on waiting until next menstrual cycle to attempt conception again.  Patient notes good emotional support from husband and family.  Notes that she is sad about the pregnancy loss, however is ok to begin trying again soon.  Desires to hold of on resuming ovulation meds at this time and desires to see what will happen naturally.  Advise patient to begin Metformin to help with fertility process (especially if ovulation induction meds are resumed),  weight loss.    Hildred LaserAnika Kalev Temme, MD [facility]

## 2016-11-22 ENCOUNTER — Encounter: Payer: Self-pay | Admitting: Obstetrics and Gynecology

## 2016-12-19 ENCOUNTER — Other Ambulatory Visit: Payer: Self-pay

## 2016-12-19 DIAGNOSIS — E282 Polycystic ovarian syndrome: Secondary | ICD-10-CM

## 2016-12-19 MED ORDER — METFORMIN HCL 500 MG PO TABS: ORAL_TABLET | ORAL | 5 refills | Status: DC

## 2017-01-01 ENCOUNTER — Other Ambulatory Visit: Payer: Self-pay

## 2017-01-01 DIAGNOSIS — E282 Polycystic ovarian syndrome: Secondary | ICD-10-CM

## 2017-01-01 MED ORDER — METFORMIN HCL 500 MG PO TABS: ORAL_TABLET | ORAL | 5 refills | Status: DC

## 2017-08-28 NOTE — L&D Delivery Note (Signed)
Delivery Summary for Jackie Plumarrie N Mccolm  Labor Events:   Preterm labor:   Rupture date:   Rupture time:   Rupture type: Intact  Fluid Color:   Induction:   Augmentation:   Complications:   Cervical ripening:          Delivery:   Episiotomy:   Lacerations:   Repair suture:   Repair # of packets:   Blood loss (ml): 500    Information for the patient's newborn:  Rich FuchsVertiz, Boy Makylie [[id number]][[id number]]    Delivery 07/31/2018 3:08 PM by  C-Section, Low Transverse Sex:  female Gestational Age: 524w4d Delivery Clinician:   Living?:         APGARS  One minute Five minutes Ten minutes  Skin color:        Heart rate:        Grimace:        Muscle tone:        Breathing:        Totals: 9  9      Presentation/position:      Resuscitation:   Cord information:    Disposition of cord blood:     Blood gases sent?  Complications:   Placenta: Delivered:       appearance Newborn Measurements: Weight: 8 lb 5.3 oz (3780 g)  Height: 21.5"  Head circumference:    Chest circumference:    Other providers:    Additional  information: Forceps:   Vacuum:   Breech:   Observed anomalies        See Dr. Oretha Milchherry's operative note for details of procedure.     Hildred Laserherry, Benigno Check, MD [facility]

## 2017-10-30 ENCOUNTER — Ambulatory Visit (INDEPENDENT_AMBULATORY_CARE_PROVIDER_SITE_OTHER): Payer: BC Managed Care – PPO | Admitting: Obstetrics and Gynecology

## 2017-10-30 ENCOUNTER — Encounter: Payer: Self-pay | Admitting: Obstetrics and Gynecology

## 2017-10-30 VITALS — BP 132/82 | HR 90 | Ht 66.0 in | Wt 262.8 lb

## 2017-10-30 DIAGNOSIS — Z8639 Personal history of other endocrine, nutritional and metabolic disease: Secondary | ICD-10-CM | POA: Diagnosis not present

## 2017-10-30 DIAGNOSIS — N926 Irregular menstruation, unspecified: Secondary | ICD-10-CM | POA: Diagnosis not present

## 2017-10-30 DIAGNOSIS — Z13220 Encounter for screening for lipoid disorders: Secondary | ICD-10-CM | POA: Diagnosis not present

## 2017-10-30 DIAGNOSIS — Z01419 Encounter for gynecological examination (general) (routine) without abnormal findings: Secondary | ICD-10-CM | POA: Diagnosis not present

## 2017-10-30 DIAGNOSIS — N949 Unspecified condition associated with female genital organs and menstrual cycle: Secondary | ICD-10-CM

## 2017-10-30 DIAGNOSIS — N970 Female infertility associated with anovulation: Secondary | ICD-10-CM

## 2017-10-30 MED ORDER — MEDROXYPROGESTERONE ACETATE 10 MG PO TABS
10.0000 mg | ORAL_TABLET | Freq: Every day | ORAL | 6 refills | Status: DC
Start: 2017-10-30 — End: 2017-12-10

## 2017-10-30 MED ORDER — LETROZOLE 2.5 MG PO TABS: 2.5000 mg | ORAL_TABLET | Freq: Every day | ORAL | 0 refills | Status: DC

## 2017-10-30 MED ORDER — LETROZOLE 2.5 MG PO TABS
2.5000 mg | ORAL_TABLET | Freq: Every day | ORAL | 0 refills | Status: DC
Start: 2017-10-30 — End: 2017-10-30

## 2017-10-30 NOTE — Progress Notes (Signed)
Pt has irregular period since her miscarriage last year Mar 2018. Pt has burning with sex started Sat 10/27/17.

## 2017-10-30 NOTE — Patient Instructions (Addendum)
Health Maintenance, Female Adopting a healthy lifestyle and getting preventive care can go a long way to promote health and wellness. Talk with your health care provider about what schedule of regular examinations is right for you. This is a good chance for you to check in with your provider about disease prevention and staying healthy. In between checkups, there are plenty of things you can do on your own. Experts have done a lot of research about which lifestyle changes and preventive measures are most likely to keep you healthy. Ask your health care provider for more information. Weight and diet Eat a healthy diet  Be sure to include plenty of vegetables, fruits, low-fat dairy products, and lean protein.  Do not eat a lot of foods high in solid fats, added sugars, or salt.  Get regular exercise. This is one of the most important things you can do for your health. ? Most adults should exercise for at least 150 minutes each week. The exercise should increase your heart rate and make you sweat (moderate-intensity exercise). ? Most adults should also do strengthening exercises at least twice a week. This is in addition to the moderate-intensity exercise.  Maintain a healthy weight  Body mass index (BMI) is a measurement that can be used to identify possible weight problems. It estimates body fat based on height and weight. Your health care provider can help determine your BMI and help you achieve or maintain a healthy weight.  For females 20 years of age and older: ? A BMI below 18.5 is considered underweight. ? A BMI of 18.5 to 24.9 is normal. ? A BMI of 25 to 29.9 is considered overweight. ? A BMI of 30 and above is considered obese.  Watch levels of cholesterol and blood lipids  You should start having your blood tested for lipids and cholesterol at 30 years of age, then have this test every 5 years.  You may need to have your cholesterol levels checked more often if: ? Your lipid or  cholesterol levels are high. ? You are older than 30 years of age. ? You are at high risk for heart disease.  Cancer screening Lung Cancer  Lung cancer screening is recommended for adults 55-80 years old who are at high risk for lung cancer because of a history of smoking.  A yearly low-dose CT scan of the lungs is recommended for people who: ? Currently smoke. ? Have quit within the past 15 years. ? Have at least a 30-pack-year history of smoking. A pack year is smoking an average of one pack of cigarettes a day for 1 year.  Yearly screening should continue until it has been 15 years since you quit.  Yearly screening should stop if you develop a health problem that would prevent you from having lung cancer treatment.  Breast Cancer  Practice breast self-awareness. This means understanding how your breasts normally appear and feel.  It also means doing regular breast self-exams. Let your health care provider know about any changes, no matter how small.  If you are in your 20s or 30s, you should have a clinical breast exam (CBE) by a health care provider every 1-3 years as part of a regular health exam.  If you are 40 or older, have a CBE every year. Also consider having a breast X-ray (mammogram) every year.  If you have a family history of breast cancer, talk to your health care provider about genetic screening.  If you are at high risk   for breast cancer, talk to your health care provider about having an MRI and a mammogram every year.  Breast cancer gene (BRCA) assessment is recommended for women who have family members with BRCA-related cancers. BRCA-related cancers include: ? Breast. ? Ovarian. ? Tubal. ? Peritoneal cancers.  Results of the assessment will determine the need for genetic counseling and BRCA1 and BRCA2 testing.  Cervical Cancer Your health care provider may recommend that you be screened regularly for cancer of the pelvic organs (ovaries, uterus, and  vagina). This screening involves a pelvic examination, including checking for microscopic changes to the surface of your cervix (Pap test). You may be encouraged to have this screening done every 3 years, beginning at age 22.  For women ages 56-65, health care providers may recommend pelvic exams and Pap testing every 3 years, or they may recommend the Pap and pelvic exam, combined with testing for human papilloma virus (HPV), every 5 years. Some types of HPV increase your risk of cervical cancer. Testing for HPV may also be done on women of any age with unclear Pap test results.  Other health care providers may not recommend any screening for nonpregnant women who are considered low risk for pelvic cancer and who do not have symptoms. Ask your health care provider if a screening pelvic exam is right for you.  If you have had past treatment for cervical cancer or a condition that could lead to cancer, you need Pap tests and screening for cancer for at least 20 years after your treatment. If Pap tests have been discontinued, your risk factors (such as having a new sexual partner) need to be reassessed to determine if screening should resume. Some women have medical problems that increase the chance of getting cervical cancer. In these cases, your health care provider may recommend more frequent screening and Pap tests.  Colorectal Cancer  This type of cancer can be detected and often prevented.  Routine colorectal cancer screening usually begins at 30 years of age and continues through 30 years of age.  Your health care provider may recommend screening at an earlier age if you have risk factors for colon cancer.  Your health care provider may also recommend using home test kits to check for hidden blood in the stool.  A small camera at the end of a tube can be used to examine your colon directly (sigmoidoscopy or colonoscopy). This is done to check for the earliest forms of colorectal  cancer.  Routine screening usually begins at age 33.  Direct examination of the colon should be repeated every 5-10 years through 30 years of age. However, you may need to be screened more often if early forms of precancerous polyps or small growths are found.  Skin Cancer  Check your skin from head to toe regularly.  Tell your health care provider about any new moles or changes in moles, especially if there is a change in a mole's shape or color.  Also tell your health care provider if you have a mole that is larger than the size of a pencil eraser.  Always use sunscreen. Apply sunscreen liberally and repeatedly throughout the day.  Protect yourself by wearing long sleeves, pants, a wide-brimmed hat, and sunglasses whenever you are outside.  Heart disease, diabetes, and high blood pressure  High blood pressure causes heart disease and increases the risk of stroke. High blood pressure is more likely to develop in: ? People who have blood pressure in the high end of  the normal range (130-139/85-89 mm Hg). ? People who are overweight or obese. ? People who are African American.  If you are 21-29 years of age, have your blood pressure checked every 3-5 years. If you are 3 years of age or older, have your blood pressure checked every year. You should have your blood pressure measured twice-once when you are at a hospital or clinic, and once when you are not at a hospital or clinic. Record the average of the two measurements. To check your blood pressure when you are not at a hospital or clinic, you can use: ? An automated blood pressure machine at a pharmacy. ? A home blood pressure monitor.  If you are between 17 years and 37 years old, ask your health care provider if you should take aspirin to prevent strokes.  Have regular diabetes screenings. This involves taking a blood sample to check your fasting blood sugar level. ? If you are at a normal weight and have a low risk for diabetes,  have this test once every three years after 30 years of age. ? If you are overweight and have a high risk for diabetes, consider being tested at a younger age or more often. Preventing infection Hepatitis B  If you have a higher risk for hepatitis B, you should be screened for this virus. You are considered at high risk for hepatitis B if: ? You were born in a country where hepatitis B is common. Ask your health care provider which countries are considered high risk. ? Your parents were born in a high-risk country, and you have not been immunized against hepatitis B (hepatitis B vaccine). ? You have HIV or AIDS. ? You use needles to inject street drugs. ? You live with someone who has hepatitis B. ? You have had sex with someone who has hepatitis B. ? You get hemodialysis treatment. ? You take certain medicines for conditions, including cancer, organ transplantation, and autoimmune conditions.  Hepatitis C  Blood testing is recommended for: ? Everyone born from [birth year] through 1963. ? Anyone with known risk factors for hepatitis C.  Sexually transmitted infections (STIs)  You should be screened for sexually transmitted infections (STIs) including gonorrhea and chlamydia if: ? You are sexually active and are younger than 30 years of age. ? You are older than 30 years of age and your health care provider tells you that you are at risk for this type of infection. ? Your sexual activity has changed since you were last screened and you are at an increased risk for chlamydia or gonorrhea. Ask your health care provider if you are at risk.  If you do not have HIV, but are at risk, it may be recommended that you take a prescription medicine daily to prevent HIV infection. This is called pre-exposure prophylaxis (PrEP). You are considered at risk if: ? You are sexually active and do not regularly use condoms or know the HIV status of your partner(s). ? You take drugs by injection. ? You are  sexually active with a partner who has HIV.  Talk with your health care provider about whether you are at high risk of being infected with HIV. If you choose to begin PrEP, you should first be tested for HIV. You should then be tested every 3 months for as long as you are taking PrEP. Pregnancy  If you are premenopausal and you may become pregnant, ask your health care provider about preconception counseling.  If you may become  pregnant, take 400 to 800 micrograms (mcg) of folic acid every day.  If you want to prevent pregnancy, talk to your health care provider about birth control (contraception). Osteoporosis and menopause  Osteoporosis is a disease in which the bones lose minerals and strength with aging. This can result in serious bone fractures. Your risk for osteoporosis can be identified using a bone density scan.  If you are 29 years of age or older, or if you are at risk for osteoporosis and fractures, ask your health care provider if you should be screened.  Ask your health care provider whether you should take a calcium or vitamin D supplement to lower your risk for osteoporosis.  Menopause may have certain physical symptoms and risks.  Hormone replacement therapy may reduce some of these symptoms and risks. Talk to your health care provider about whether hormone replacement therapy is right for you. Follow these instructions at home:  Schedule regular health, dental, and eye exams.  Stay current with your immunizations.  Do not use any tobacco products including cigarettes, chewing tobacco, or electronic cigarettes.  If you are pregnant, do not drink alcohol.  If you are breastfeeding, limit how much and how often you drink alcohol.  Limit alcohol intake to no more than 1 drink per day for nonpregnant women. One drink equals 12 ounces of beer, 5 ounces of wine, or 1 ounces of hard liquor.  Do not use street drugs.  Do not share needles.  Ask your health care  provider for help if you need support or information about quitting drugs.  Tell your health care provider if you often feel depressed.  Tell your health care provider if you have ever been abused or do not feel safe at home. This information is not intended to replace advice given to you by your health care provider. Make sure you discuss any questions you have with your health care provider. Document Released: 02/27/2011 Document Revised: 01/20/2016 Document Reviewed: 05/18/2015 Elsevier Interactive Patient Education  2018 [place] USE IT? Clomid helps your ovaries to release eggs (ovulate).  HOW TO USE IT? Clomid is taken as a pill usually on days 5,6,7,8, & 9 of your cycle.  Day 1 is the first day of your period. The dose or duration may be changed to achieve ovulation.  Provera (progesterone) may first be used to bring on a period for some patients.  If you do not get pregnant this cycle, for your next cycles, take on days 1, 2, 3, 4 and 5.  If you do not get a period, take Provera 10 mg daily for 10 days to bring on a period; the first day you get bleeding is Day 1 of your cycle. The day of ovulation on Clomid is usually between cycle day 14 and 17.  Having sexual intercourse at least every other day between cycle day 13 and 18 will improve your chances of becoming pregnant during the Clomid cycle.  You may monitor your ovulation using basal body temperature charts or with ovulation kits.  If using the ovulation predictor kits, having intercourse the day of the surge and the two days following is recommended. If you get your period, call when it starts for an appointment with your doctor, so that an exam may be done, and another Clomid cycle can be considered if appropriate. If you do not get a period by day 35 of the cycle, please get a  blood pregnancy test.  If it is negative, speak to your doctor for instructions to bring on another period and  to plan a follow-up appointment.  THINGS TO KNOW: If you get pregnant while using Clomid, your chance of twins is 7% and triplets is less than 1%. Some studies have suggested the use of "fertility drugs" may increase your risk of ovarian cancers in the future.  It is unclear if these drugs increase the risk, or people who have problems with fertility are prone for these cancers.  If there is an actual risk, it is very low.  If you have a history of liver problems or ovarian cancer, it may be wise to avoid this medication.  SIDE EFFECTS:  The most common side effect is hot flashes (20%).  Breast tenderness, headaches, nausea, bloating may also occur at different times.  Less than 3/1,000 people have dryness or loss of hair.  Persistent ovarian cysts may form from the use of this medication.  Ovarian hyperstimulation syndrome is a rare side effect at low doses.  Visual changes like flashes of light or blurring.

## 2017-10-30 NOTE — Progress Notes (Signed)
GYNECOLOGY ANNUAL PHYSICAL EXAM PROGRESS NOTE  Subjective:    Deanna Newman is a 32 y.o. 421P1001 female who presents for annual exam. The patient is sexually active. The patient wears seatbelts: yes. The patient participates in regular exercise: no. Has the patient ever been transfused or tattooed?: no. The patient reports that there is not domestic violence in her life.   The patient has the following complaints today. 1. Notes irregular cycles since miscarriage in March 2018.  Periods occur regularly for 3-5 months, and then skips 1-2 months.  Has been trying to conceive since then.  Has not been using Provera regularly for missed periods. Stopped Clomid last year due to side effects.  2. Burning with intercourse x 3 days. Denies vaginal discharge or abnormal bleeding.  Attempted to use a lubricant which did not help much.   Gynecologic History Menarche age: [age]  Patient's last menstrual period was 09/29/2017. Contraception: none.  History of STI's: denies Last Pap: 2018. Results were: normal.  Notes h/o abnormal pap smear in 2010, followed with colposcopy.    Obstetric History   G3   P1   T1   P0   A1   L1    SAB1   TAB0   Ectopic0   Multiple0   Live Births1     # Outcome Date GA Lbr Len/2nd Weight Sex Delivery Anes PTL Lv  3 Gravida           2 SAB 11/11/16 [id number]         1 Term 05/23/12 [id number]  8 lb 4 oz (3.742 kg) M CS-LTranv   LIV      Past Medical History:  Diagnosis Date  . Anemia   . Migraine   . Vaginal Pap smear, abnormal 2010   followed by colposcopy    Past Surgical History:  Procedure Laterality Date  . CESAREAN SECTION    . WISDOM TOOTH EXTRACTION      Family History  Problem Relation Age of Onset  . Skin cancer Mother   . GER disease Mother   . Diabetes Father   . COPD Father   . Asthma Father   . Hypertension Father   . Heart failure Father   . Hypertension Sister     Social History   Socioeconomic History  . Marital status: Divorced    Spouse name: Not on file  . Number of children: Not on file  . Years of education: Not on file  . Highest education level: Not on file  Social Needs  . Financial resource strain: Not on file  . Food insecurity - worry: Not on file  . Food insecurity - inability: Not on file  . Transportation needs - medical: Not on file  . Transportation needs - non-medical: Not on file  Occupational History  . Not on file  Tobacco Use  . Smoking status: Never Smoker  . Smokeless tobacco: Never Used  Substance and Sexual Activity  . Alcohol use: Yes    Comment: Occasionally   . Drug use: No  . Sexual activity: Yes    Birth control/protection: None    Comment: Seeking pregnancy   Other Topics Concern  . Not on file  Social History Narrative  . Not on file    No current outpatient medications on file prior to visit.   No current facility-administered medications on file prior to visit.     Allergies  Allergen Reactions  . Sumatriptan Anaphylaxis  Review of Systems Constitutional: negative for chills, fatigue, fevers and sweats Eyes: negative for irritation, redness and visual disturbance Ears, nose, mouth, throat, and face: negative for hearing loss, nasal congestion, snoring and tinnitus Respiratory: negative for asthma, cough, sputum Cardiovascular: negative for chest pain, dyspnea, exertional chest pressure/discomfort, irregular heart beat, palpitations and syncope Gastrointestinal: negative for abdominal pain, change in bowel habits, nausea and vomiting.   Genitourinary:  Burning with intercourse x 3 days, and irregular periods.  negative for genital lesions, sexual problems and vaginal discharge, dysuria and urinary incontinence Integument/breast: negative for breast lump, breast tenderness and nipple discharge Hematologic/lymphatic: negative for bleeding and easy bruising Musculoskeletal:negative for back pain and muscle weakness Neurological: negative for dizziness,  headaches, vertigo and weakness Endocrine: negative for diabetic symptoms including polydipsia, polyuria and skin dryness Allergic/Immunologic: negative for hay fever and urticaria     Objective:  Blood pressure 132/82, pulse 90, height 5\' 6"  (1.676 m), weight 262 lb 12.8 oz (119.2 kg), last menstrual period 09/29/2017, unknown if currently breastfeeding. Body mass index is 42.42 kg/m.  General Appearance:    Alert, cooperative, no distress, appears stated age. Morbid obese  Head:    Normocephalic, without obvious abnormality, atraumatic  Eyes:    PERRL, conjunctiva/corneas clear, EOM's intact, both eyes  Ears:    Normal external ear canals, both ears  Nose:   Nares normal, septum midline, mucosa normal, no drainage or sinus tenderness  Throat:   Lips, mucosa, and tongue normal; teeth and gums normal  Neck:   Supple, symmetrical, trachea midline, no adenopathy; thyroid: no enlargement/tenderness/nodules; no carotid bruit or JVD  Back:     Symmetric, no curvature, ROM normal, no CVA tenderness  Lungs:     Clear to auscultation bilaterally, respirations unlabored  Chest Wall:    No tenderness or deformity   Heart:    Regular rate and rhythm, S1 and S2 normal, no murmur, rub or gallop  Breast Exam:    No tenderness, masses, or nipple abnormality  Abdomen:     Soft, non-tender, bowel sounds active all four quadrants, no masses, no organomegaly.    Genitalia:    Pelvic:external genitalia normal, vagina without lesions, or tenderness.  Small amount of thin white discharge present, no odor. Rectovaginal septum  normal. Cervix normal in appearance, no cervical motion tenderness, no adnexal masses or tenderness.  Uterus normal size, shape, mobile, regular contours, nontender.  Rectal:    Normal external sphincter.  No hemorrhoids appreciated. Internal exam not done.   Extremities:   Extremities normal, atraumatic, no cyanosis or edema  Pulses:   2+ and symmetric all extremities  Skin:   Skin color,  texture, turgor normal, no rashes or lesions  Lymph nodes:   Cervical, supraclavicular, and axillary nodes normal  Neurologic:   CNII-XII intact, normal strength, sensation and reflexes throughout     Labs:  Lab Results  Component Value Date   WBC 8.4 10/24/2016   HGB 11.2 10/24/2016   HCT 35.3 10/24/2016   MCV 74 (L) 10/24/2016   PLT 381 (H) 10/24/2016    Lab Results  Component Value Date   CREATININE 0.63 10/24/2016   BUN 10 10/24/2016   NA 141 10/24/2016   K 4.4 10/24/2016   CL 102 10/24/2016   CO2 23 10/24/2016    Lab Results  Component Value Date   ALT 18 10/24/2016   AST 18 10/24/2016   ALKPHOS 86 10/24/2016   BILITOT <0.2 10/24/2016    Lab Results  Component  Value Date   TSH 1.170 01/12/2016    Assessment:    Healthy female exam.   Morbid Obesity (Class III) Irregular menses Secondary infertility STD screening Vitamin D deficiency  Plan:   Pap smear up to date Blood tests: CBC with diff, Comprehensive metabolic panel, lipid panel, and Vitamin D level Breast self exam technique reviewed and patient encouraged to perform self-exam monthly. Discussed healthy lifestyle modifications. Continue to encourage weight loss.  Vaginal burning with no evidence of vaginal lesions or lacerations. Nuswab performed today. Contraception: none.  Patient planning to conceive.  Has h/o irregular menses.  Will resume Provera for irregular menses and initiate Femara.  Continue to use ovulation kits.  Declines flu vaccine.  Given handout on HPV vaccine.   Follow up in 1 year for annual exam.    Hildred Laser, MD [facility]

## 2017-10-31 ENCOUNTER — Other Ambulatory Visit: Payer: Self-pay | Admitting: Obstetrics and Gynecology

## 2017-10-31 LAB — COMPREHENSIVE METABOLIC PANEL
A/G RATIO: 1.2 (ref 1.2–2.2)
ALBUMIN: 3.9 g/dL (ref 3.5–5.5)
ALK PHOS: 90 IU/L (ref 39–117)
ALT: 16 IU/L (ref 0–32)
AST: 18 IU/L (ref 0–40)
BILIRUBIN TOTAL: 0.2 mg/dL (ref 0.0–1.2)
BUN / CREAT RATIO: 17 (ref 9–23)
BUN: 12 mg/dL (ref 6–20)
CHLORIDE: 103 mmol/L (ref 96–106)
CO2: 22 mmol/L (ref 20–29)
Calcium: 9.3 mg/dL (ref 8.7–10.2)
Creatinine, Ser: 0.71 mg/dL (ref 0.57–1.00)
GFR calc Af Amer: 132 mL/min/{1.73_m2} (ref >59)
GFR calc non Af Amer: 115 mL/min/{1.73_m2} (ref >59)
GLOBULIN, TOTAL: 3.3 g/dL (ref 1.5–4.5)
Glucose: 84 mg/dL (ref 65–99)
POTASSIUM: 4.1 mmol/L (ref 3.5–5.2)
SODIUM: 140 mmol/L (ref 134–144)
Total Protein: 7.2 g/dL (ref 6.0–8.5)

## 2017-10-31 LAB — CBC
HEMATOCRIT: 35.5 % (ref 34.0–46.6)
Hemoglobin: 10.9 g/dL — ABNORMAL LOW (ref 11.1–15.9)
MCH: 22.8 pg — AB (ref 26.6–33.0)
MCHC: 30.7 g/dL — AB (ref 31.5–35.7)
MCV: 74 fL — AB (ref 79–97)
Platelets: 369 10*3/uL (ref 150–379)
RBC: 4.79 x10E6/uL (ref 3.77–5.28)
RDW: 15.6 % — AB (ref 12.3–15.4)
WBC: 8.6 10*3/uL (ref 3.4–10.8)

## 2017-10-31 LAB — TSH: TSH: 2.91 u[IU]/mL (ref 0.450–4.500)

## 2017-10-31 LAB — LIPID PANEL
CHOLESTEROL TOTAL: 155 mg/dL (ref 100–199)
Chol/HDL Ratio: 3.8 ratio (ref 0.0–4.4)
HDL: 41 mg/dL (ref >39)
LDL Calculated: 89 mg/dL (ref 0–99)
Triglycerides: 125 mg/dL (ref 0–149)
VLDL Cholesterol Cal: 25 mg/dL (ref 5–40)

## 2017-10-31 LAB — VITAMIN D 25 HYDROXY (VIT D DEFICIENCY, FRACTURES): VIT D 25 HYDROXY: 18.8 ng/mL — AB (ref 30.0–100.0)

## 2017-10-31 MED ORDER — VITAMIN D (ERGOCALCIFEROL) 1.25 MG (50000 UNIT) PO CAPS: 50000.0000 [IU] | ORAL_CAPSULE | ORAL | 0 refills | Status: DC

## 2017-10-31 MED ORDER — FERROUS SULFATE 325 (65 FE) MG PO TABS: 325.0000 mg | ORAL_TABLET | Freq: Every day | ORAL | 2 refills | Status: DC

## 2017-11-02 ENCOUNTER — Other Ambulatory Visit: Payer: Self-pay | Admitting: Obstetrics and Gynecology

## 2017-11-02 LAB — NUSWAB VAGINITIS PLUS (VG+)
Atopobium vaginae: HIGH Score — AB
BVAB 2: HIGH {score} — AB
CHLAMYDIA TRACHOMATIS, NAA: NEGATIVE
Candida albicans, NAA: NEGATIVE
Candida glabrata, NAA: NEGATIVE
MEGASPHAERA 1: HIGH {score} — AB
Neisseria gonorrhoeae, NAA: NEGATIVE
Trich vag by NAA: NEGATIVE

## 2017-11-02 MED ORDER — METRONIDAZOLE 500 MG PO TABS: 500.0000 mg | ORAL_TABLET | Freq: Two times a day (BID) | ORAL | 0 refills | Status: DC

## 2017-11-16 ENCOUNTER — Telehealth: Payer: Self-pay | Admitting: Obstetrics and Gynecology

## 2017-11-16 MED ORDER — FLUCONAZOLE 100 MG PO TABS: 100.0000 mg | ORAL_TABLET | Freq: Every day | ORAL | 0 refills | Status: DC

## 2017-11-16 NOTE — Telephone Encounter (Signed)
Patient called stating she has developed a yeast infection after taking antibioitics. She wanted to know if something could be called in for her. Thanks

## 2017-11-16 NOTE — Telephone Encounter (Signed)
Pt called back send in a prescription of diflucan to her pharmacy for yeast infection.

## 2017-11-19 ENCOUNTER — Encounter: Payer: Self-pay | Admitting: Obstetrics and Gynecology

## 2017-11-19 ENCOUNTER — Other Ambulatory Visit: Payer: Self-pay

## 2017-11-19 MED ORDER — FLUCONAZOLE 100 MG PO TABS: 100.0000 mg | ORAL_TABLET | Freq: Every day | ORAL | 0 refills | Status: DC

## 2017-11-21 ENCOUNTER — Encounter: Payer: Self-pay | Admitting: Obstetrics and Gynecology

## 2017-12-10 ENCOUNTER — Encounter: Payer: Self-pay | Admitting: Obstetrics and Gynecology

## 2017-12-10 ENCOUNTER — Ambulatory Visit: Payer: BC Managed Care – PPO | Admitting: Obstetrics and Gynecology

## 2017-12-10 VITALS — BP 121/85 | HR 91 | Ht 66.0 in | Wt 262.5 lb

## 2017-12-10 DIAGNOSIS — Z8759 Personal history of other complications of pregnancy, childbirth and the puerperium: Secondary | ICD-10-CM

## 2017-12-10 DIAGNOSIS — N898 Other specified noninflammatory disorders of vagina: Secondary | ICD-10-CM

## 2017-12-10 DIAGNOSIS — Z8742 Personal history of other diseases of the female genital tract: Secondary | ICD-10-CM | POA: Diagnosis not present

## 2017-12-10 DIAGNOSIS — O219 Vomiting of pregnancy, unspecified: Secondary | ICD-10-CM

## 2017-12-10 DIAGNOSIS — Z3201 Encounter for pregnancy test, result positive: Secondary | ICD-10-CM | POA: Diagnosis not present

## 2017-12-10 MED ORDER — DOXYLAMINE-PYRIDOXINE ER 20-20 MG PO TBCR: 1.0000 | EXTENDED_RELEASE_TABLET | Freq: Two times a day (BID) | ORAL | 3 refills | Status: DC

## 2017-12-10 NOTE — Patient Instructions (Addendum)
Common Medications Safe in Pregnancy  Acne:      Constipation:  Benzoyl Peroxide     Colace  Clindamycin      Dulcolax Suppository  Topica Erythromycin     Fibercon  Salicylic Acid      Metamucil         Miralax AVOID:        Senakot   Accutane    Cough:  Retin-A       Cough Drops  Tetracycline      Phenergan w/ Codeine if Rx  Minocycline      Robitussin (Plain & DM)  Antibiotics:     Crabs/Lice:  Ceclor       RID  Cephalosporins    AVOID:  E-Mycins      Kwell  Keflex  Macrobid/Macrodantin   Diarrhea:  Penicillin      Kao-Pectate  Zithromax      Imodium AD         PUSH FLUIDS AVOID:       Cipro     Fever:  Tetracycline      Tylenol (Regular or Extra  Minocycline       Strength)  Levaquin      Extra Strength-Do not          Exceed 8 tabs/24 hrs Caffeine:        <200mg/day (equiv. To 1 cup of coffee or  approx. 3 12 oz sodas)         Gas: Cold/Hayfever:       Gas-X  Benadryl      Mylicon  Claritin       Phazyme  **Claritin-D        Chlor-Trimeton    Headaches:  Dimetapp      ASA-Free Excedrin  Drixoral-Non-Drowsy     Cold Compress  Mucinex (Guaifenasin)     Tylenol (Regular or Extra  Sudafed/Sudafed-12 Hour     Strength)  **Sudafed PE Pseudoephedrine   Tylenol Cold & Sinus     Vicks Vapor Rub  Zyrtec  **AVOID if Problems With Blood Pressure         Heartburn: Avoid lying down for at least 1 hour after meals  Aciphex      Maalox     Rash:  Milk of Magnesia     Benadryl    Mylanta       1% Hydrocortisone Cream  Pepcid  Pepcid Complete   Sleep Aids:  Prevacid      Ambien   Prilosec       Benadryl  Rolaids       Chamomile Tea  Tums (Limit 4/day)     Unisom  Zantac       Tylenol PM         Warm milk-add vanilla or  Hemorrhoids:       Sugar for taste  Anusol/Anusol H.C.  (RX: Analapram 2.5%)  Sugar Substitutes:  Hydrocortisone OTC     Ok in moderation  Preparation H      Tucks        Vaseline lotion applied to tissue with  wiping    Herpes:     Throat:  Acyclovir      Oragel  Famvir  Valtrex     Vaccines:         Flu Shot Leg Cramps:       *Gardasil  Benadryl      Hepatitis A         Hepatitis B Nasal Spray:         Pneumovax  Saline Nasal Spray     Polio Booster         Tetanus Nausea:       Tuberculosis test or PPD  Vitamin B6 25 mg TID   AVOID:    Dramamine      *Gardasil  Emetrol       Live Poliovirus  Ginger Root 250 mg QID    MMR (measles, mumps &  High Complex Carbs @ Bedtime    rebella)  Sea Bands-Accupressure    Varicella (Chickenpox)  Unisom 1/2 tab TID     *No known complications           If received before Pain:         Known pregnancy;   Darvocet       Resume series after  Lortab        Delivery  Percocet    Yeast:   Tramadol      Femstat  Tylenol 3      Gyne-lotrimin  Ultram       Monistat  Vicodin           MISC:         All Sunscreens           Hair Coloring/highlights          Insect Repellant's          (Including DEET)         Mystic Tans      First Trimester of Pregnancy The first trimester of pregnancy is from week 1 until the end of week 13 (months 1 through 3). A week after a sperm fertilizes an egg, the egg will implant on the wall of the uterus. This embryo will begin to develop into a baby. Genes from you and your partner will form the baby. The female genes will determine whether the baby will be a boy or a girl. At 6-8 weeks, the eyes and face will be formed, and the heartbeat can be seen on ultrasound. At the end of 12 weeks, all the baby's organs will be formed. Now that you are pregnant, you will want to do everything you can to have a healthy baby. Two of the most important things are to get good prenatal care and to follow your health care provider's instructions. Prenatal care is all the medical care you receive before the baby's birth. This care will help prevent, find, and treat any problems during the pregnancy and childbirth. Body changes during your first  trimester Your body goes through many changes during pregnancy. The changes vary from woman to woman.  You may gain or lose a couple of pounds at first.  You may feel sick to your stomach (nauseous) and you may throw up (vomit). If the vomiting is uncontrollable, call your health care provider.  You may tire easily.  You may develop headaches that can be relieved by medicines. All medicines should be approved by your health care provider.  You may urinate more often. Painful urination may mean you have a bladder infection.  You may develop heartburn as a result of your pregnancy.  You may develop constipation because certain hormones are causing the muscles that push stool through your intestines to slow down.  You may develop hemorrhoids or swollen veins (varicose veins).  Your breasts may begin to grow larger and become tender. Your nipples may stick out more, and the tissue that surrounds them (areola) may become darker.  Your gums may bleed   and may be sensitive to brushing and flossing.  Dark spots or blotches (chloasma, mask of pregnancy) may develop on your face. This will likely fade after the baby is born.  Your menstrual periods will stop.  You may have a loss of appetite.  You may develop cravings for certain kinds of food.  You may have changes in your emotions from day to day, such as being excited to be pregnant or being concerned that something may go wrong with the pregnancy and baby.  You may have more vivid and strange dreams.  You may have changes in your hair. These can include thickening of your hair, rapid growth, and changes in texture. Some women also have hair loss during or after pregnancy, or hair that feels dry or thin. Your hair will most likely return to normal after your baby is born.  What to expect at prenatal visits During a routine prenatal visit:  You will be weighed to make sure you and the baby are growing normally.  Your blood pressure  will be taken.  Your abdomen will be measured to track your baby's growth.  The fetal heartbeat will be listened to between weeks 10 and 14 of your pregnancy.  Test results from any previous visits will be discussed.  Your health care provider may ask you:  How you are feeling.  If you are feeling the baby move.  If you have had any abnormal symptoms, such as leaking fluid, bleeding, severe headaches, or abdominal cramping.  If you are using any tobacco products, including cigarettes, chewing tobacco, and electronic cigarettes.  If you have any questions.  Other tests that may be performed during your first trimester include:  Blood tests to find your blood type and to check for the presence of any previous infections. The tests will also be used to check for low iron levels (anemia) and protein on red blood cells (Rh antibodies). Depending on your risk factors, or if you previously had diabetes during pregnancy, you may have tests to check for high blood sugar that affects pregnant women (gestational diabetes).  Urine tests to check for infections, diabetes, or protein in the urine.  An ultrasound to confirm the proper growth and development of the baby.  Fetal screens for spinal cord problems (spina bifida) and Down syndrome.  HIV (human immunodeficiency virus) testing. Routine prenatal testing includes screening for HIV, unless you choose not to have this test.  You may need other tests to make sure you and the baby are doing well.  Follow these instructions at home: Medicines  Follow your health care provider's instructions regarding medicine use. Specific medicines may be either safe or unsafe to take during pregnancy.  Take a prenatal vitamin that contains at least 600 micrograms (mcg) of folic acid.  If you develop constipation, try taking a stool softener if your health care provider approves. Eating and drinking  Eat a balanced diet that includes fresh fruits and  vegetables, whole grains, good sources of protein such as meat, eggs, or tofu, and low-fat dairy. Your health care provider will help you determine the amount of weight gain that is right for you.  Avoid raw meat and uncooked cheese. These carry germs that can cause birth defects in the baby.  Eating four or five small meals rather than three large meals a day may help relieve nausea and vomiting. If you start to feel nauseous, eating a few soda crackers can be helpful. Drinking liquids between meals, instead of   during meals, also seems to help ease nausea and vomiting.  Limit foods that are high in fat and processed sugars, such as fried and sweet foods.  To prevent constipation: ? Eat foods that are high in fiber, such as fresh fruits and vegetables, whole grains, and beans. ? Drink enough fluid to keep your urine clear or pale yellow. Activity  Exercise only as directed by your health care provider. Most women can continue their usual exercise routine during pregnancy. Try to exercise for 30 minutes at least 5 days a week. Exercising will help you: ? Control your weight. ? Stay in shape. ? Be prepared for labor and delivery.  Experiencing pain or cramping in the lower abdomen or lower back is a good sign that you should stop exercising. Check with your health care provider before continuing with normal exercises.  Try to avoid standing for long periods of time. Move your legs often if you must stand in one place for a long time.  Avoid heavy lifting.  Wear low-heeled shoes and practice good posture.  You may continue to have sex unless your health care provider tells you not to. Relieving pain and discomfort  Wear a good support bra to relieve breast tenderness.  Take warm sitz baths to soothe any pain or discomfort caused by hemorrhoids. Use hemorrhoid cream if your health care provider approves.  Rest with your legs elevated if you have leg cramps or low back pain.  If you  develop varicose veins in your legs, wear support hose. Elevate your feet for 15 minutes, 3-4 times a day. Limit salt in your diet. Prenatal care  Schedule your prenatal visits by the twelfth week of pregnancy. They are usually scheduled monthly at first, then more often in the last 2 months before delivery.  Write down your questions. Take them to your prenatal visits.  Keep all your prenatal visits as told by your health care provider. This is important. Safety  Wear your seat belt at all times when driving.  Make a list of emergency phone numbers, including numbers for family, friends, the hospital, and police and fire departments. General instructions  Ask your health care provider for a referral to a local prenatal education class. Begin classes no later than the beginning of month 6 of your pregnancy.  Ask for help if you have counseling or nutritional needs during pregnancy. Your health care provider can offer advice or refer you to specialists for help with various needs.  Do not use hot tubs, steam rooms, or saunas.  Do not douche or use tampons or scented sanitary pads.  Do not cross your legs for long periods of time.  Avoid cat litter boxes and soil used by cats. These carry germs that can cause birth defects in the baby and possibly loss of the fetus by miscarriage or stillbirth.  Avoid all smoking, herbs, alcohol, and medicines not prescribed by your health care provider. Chemicals in these products affect the formation and growth of the baby.  Do not use any products that contain nicotine or tobacco, such as cigarettes and e-cigarettes. If you need help quitting, ask your health care provider. You may receive counseling support and other resources to help you quit.  Schedule a dentist appointment. At home, brush your teeth with a soft toothbrush and be gentle when you floss. Contact a health care provider if:  You have dizziness.  You have mild pelvic cramps, pelvic  pressure, or nagging pain in the abdominal area.    You have persistent nausea, vomiting, or diarrhea.  You have a bad smelling vaginal discharge.  You have pain when you urinate.  You notice increased swelling in your face, hands, legs, or ankles.  You are exposed to fifth disease or chickenpox.  You are exposed to German measles (rubella) and have never had it. Get help right away if:  You have a fever.  You are leaking fluid from your vagina.  You have spotting or bleeding from your vagina.  You have severe abdominal cramping or pain.  You have rapid weight gain or loss.  You vomit blood or material that looks like coffee grounds.  You develop a severe headache.  You have shortness of breath.  You have any kind of trauma, such as from a fall or a car accident. Summary  The first trimester of pregnancy is from week 1 until the end of week 13 (months 1 through 3).  Your body goes through many changes during pregnancy. The changes vary from woman to woman.  You will have routine prenatal visits. During those visits, your health care provider will examine you, discuss any test results you may have, and talk with you about how you are feeling. This information is not intended to replace advice given to you by your health care provider. Make sure you discuss any questions you have with your health care provider. Document Released: 08/08/2001 Document Revised: 07/26/2016 Document Reviewed: 07/26/2016 Elsevier Interactive Patient Education  2018 [employer]  

## 2017-12-10 NOTE — Progress Notes (Signed)
Pt has discharge with odor. Pt had a positive in office pregnancy test. All day morning sickness.

## 2017-12-10 NOTE — Progress Notes (Signed)
    GYNECOLOGY CLINIC PROGRESS NOTE Subjective:    Deanna Newman is a 32 y.o. female who presents for evaluation of amenorrhea. She believes she could be pregnant. Pregnancy is desired. Sexual Activity: single partner, contraception: none.   She has had an issue with infertility in the past. Was planning to start infertility regimen (ovulation induction with Femara) next month. Current symptoms also include: morning sickness and positive home pregnancy test. Last period was normal. Patient's last menstrual period was 09/29/2017.    Of note, patient complains of vaginal discharge with odor. Notes that she was treated in March for BV infection, then shortly after this had to initiate antibiotics to treat a bronchitis infection. States that after this, she felt as though she had a yeast infection and received treatment for this as well.  Notes that she still had residual symptoms so took another Diflucan several days after this.   The following portions of the patient's history were reviewed and updated as appropriate: allergies, current medications, past family history, past medical history, past social history, past surgical history and problem list.  Review of Systems Pertinent items noted in HPI and remainder of comprehensive ROS otherwise negative.     Objective:    BP 121/85   Pulse 91   Ht 5\' 6"  (1.676 m)   Wt 262 lb 8 oz (119.1 kg)   LMP 09/29/2017   BMI 42.37 kg/m  General: alert, no distress and no acute distress   Pelvis:  external genitalia normal, rectovaginal septum normal.  Vagina with scan thin white discharge, no odor.  Cervix normal appearing, no lesions and no motion tenderness.  Uterus mobile, nontender, normal shape and size.  Adnexae non-palpable, nontender bilaterally.    Lab Review Urine HCG: positive  Microscopic wet-mount exam shows normal epithelial cells. No trichomonads or clue cells.  Very few hyphae present, no budding yeast.  Assessment:    Absence of  menstruation with positive pregnancy test.    Vaginal discharge Morning sickness H/o miscarriage H/o infertility  Plan:   - Pregnancy Test: Positive: EDC: 07/06/2018, with EGA 10.2 weeks (however patient is unsure if this is true as she had a  negative pregnancy test towards the beginning of March). Briefly discussed pre-natal care options.  Encouraged well-balanced diet, plenty of rest when needed, pre-natal vitamins daily and walking for exercise. Discussed self-help for nausea, avoiding OTC medications until consulting provider or pharmacist, other than Tylenol as needed, minimal caffeine (1-2 cups daily) and avoiding alcohol. Will also prescribe Bonjesta in case of unresolved morning sickness with modification of diet. She will schedule her initial OB visit within the next month. Feel free to call with any questions.  - Will order dating/viability scan in light of patient's history of infertility and previous miscarriage. - Vaginal discharge with no significant findings.  Advised on use of Vagisil or Summer's Eve as no active infection appears to be present.    Hildred Laserherry, Ashby Moskal, MD [facility]

## 2017-12-12 ENCOUNTER — Ambulatory Visit (INDEPENDENT_AMBULATORY_CARE_PROVIDER_SITE_OTHER): Payer: BC Managed Care – PPO

## 2017-12-12 ENCOUNTER — Other Ambulatory Visit: Payer: Self-pay

## 2017-12-12 ENCOUNTER — Other Ambulatory Visit: Payer: Self-pay | Admitting: Obstetrics and Gynecology

## 2017-12-12 DIAGNOSIS — Z3201 Encounter for pregnancy test, result positive: Secondary | ICD-10-CM | POA: Diagnosis not present

## 2017-12-12 DIAGNOSIS — Z8742 Personal history of other diseases of the female genital tract: Secondary | ICD-10-CM

## 2017-12-12 DIAGNOSIS — Z8759 Personal history of other complications of pregnancy, childbirth and the puerperium: Secondary | ICD-10-CM

## 2017-12-12 MED ORDER — DOXYLAMINE-PYRIDOXINE 10-10 MG PO TBEC: 10.0000 mg | DELAYED_RELEASE_TABLET | Freq: Four times a day (QID) | ORAL | 1 refills | Status: DC | PRN

## 2017-12-31 ENCOUNTER — Ambulatory Visit (INDEPENDENT_AMBULATORY_CARE_PROVIDER_SITE_OTHER): Payer: BC Managed Care – PPO | Admitting: Obstetrics and Gynecology

## 2017-12-31 VITALS — BP 109/69 | HR 83 | Ht 66.0 in | Wt 265.7 lb

## 2017-12-31 DIAGNOSIS — Z3491 Encounter for supervision of normal pregnancy, unspecified, first trimester: Secondary | ICD-10-CM

## 2017-12-31 NOTE — Progress Notes (Signed)
Jackie Plum presents for NOB nurse interview visit. Pregnancy confirmation done here at [facility] . G-4.  P- 1   . Pregnancy education material explained and given. _No  cats in the home. NOB labs ordered. (TSH/HbgA1c due to Increased BMI) HIV labs and Drug screen were explained optional and she did not decline. Drug screen ordered PNV encouraged. Genetic screening options discussed. Genetic testing:unsure.  Pt may discuss with provider. Pt. To follow up with provider she already has appointment with Dr. Valentino Saxon

## 2017-12-31 NOTE — Patient Instructions (Signed)
First Trimester of Pregnancy The first trimester of pregnancy is from week 1 until the end of week 13 (months 1 through 3). During this time, your baby will begin to develop inside you. At 6-8 weeks, the eyes and face are formed, and the heartbeat can be seen on ultrasound. At the end of 12 weeks, all the baby's organs are formed. Prenatal care is all the medical care you receive before the birth of your baby. Make sure you get good prenatal care and follow all of your doctor's instructions. Follow these instructions at home: Medicines  Take over-the-counter and prescription medicines only as told by your doctor. Some medicines are safe and some medicines are not safe during pregnancy.  Take a prenatal vitamin that contains at least 600 micrograms (mcg) of folic acid.  If you have trouble pooping (constipation), take medicine that will make your stool soft (stool softener) if your doctor approves. Eating and drinking  Eat regular, healthy meals.  Your doctor will tell you the amount of weight gain that is right for you.  Avoid raw meat and uncooked cheese.  If you feel sick to your stomach (nauseous) or throw up (vomit): ? Eat 4 or 5 small meals a day instead of 3 large meals. ? Try eating a few soda crackers. ? Drink liquids between meals instead of during meals.  To prevent constipation: ? Eat foods that are high in fiber, like fresh fruits and vegetables, whole grains, and beans. ? Drink enough fluids to keep your pee (urine) clear or pale yellow. Activity  Exercise only as told by your doctor. Stop exercising if you have cramps or pain in your lower belly (abdomen) or low back.  Do not exercise if it is too hot, too humid, or if you are in a place of great height (high altitude).  Try to avoid standing for long periods of time. Move your legs often if you must stand in one place for a long time.  Avoid heavy lifting.  Wear low-heeled shoes. Sit and stand up straight.  You  can have sex unless your doctor tells you not to. Relieving pain and discomfort  Wear a good support bra if your breasts are sore.  Take warm water baths (sitz baths) to soothe pain or discomfort caused by hemorrhoids. Use hemorrhoid cream if your doctor says it is okay.  Rest with your legs raised if you have leg cramps or low back pain.  If you have puffy, bulging veins (varicose veins) in your legs: ? Wear support hose or compression stockings as told by your doctor. ? Raise (elevate) your feet for 15 minutes, 3-4 times a day. ? Limit salt in your food. Prenatal care  Schedule your prenatal visits by the twelfth week of pregnancy.  Write down your questions. Take them to your prenatal visits.  Keep all your prenatal visits as told by your doctor. This is important. Safety  Wear your seat belt at all times when driving.  Make a list of emergency phone numbers. The list should include numbers for family, friends, the hospital, and police and fire departments. General instructions  Ask your doctor for a referral to a local prenatal class. Begin classes no later than at the start of month 6 of your pregnancy.  Ask for help if you need counseling or if you need help with nutrition. Your doctor can give you advice or tell you where to go for help.  Do not use hot tubs, steam rooms, or   saunas.  Do not douche or use tampons or scented sanitary pads.  Do not cross your legs for long periods of time.  Avoid all herbs and alcohol. Avoid drugs that are not approved by your doctor.  Do not use any tobacco products, including cigarettes, chewing tobacco, and electronic cigarettes. If you need help quitting, ask your doctor. You may get counseling or other support to help you quit.  Avoid cat litter boxes and soil used by cats. These carry germs that can cause birth defects in the baby and can cause a loss of your baby (miscarriage) or stillbirth.  Visit your dentist. At home, brush  your teeth with a soft toothbrush. Be gentle when you floss. Contact a doctor if:  You are dizzy.  You have mild cramps or pressure in your lower belly.  You have a nagging pain in your belly area.  You continue to feel sick to your stomach, you throw up, or you have watery poop (diarrhea).  You have a bad smelling fluid coming from your vagina.  You have pain when you pee (urinate).  You have increased puffiness (swelling) in your face, hands, legs, or ankles. Get help right away if:  You have a fever.  You are leaking fluid from your vagina.  You have spotting or bleeding from your vagina.  You have very bad belly cramping or pain.  You gain or lose weight rapidly.  You throw up blood. It may look like coffee grounds.  You are around people who have German measles, fifth disease, or chickenpox.  You have a very bad headache.  You have shortness of breath.  You have any kind of trauma, such as from a fall or a car accident. Summary  The first trimester of pregnancy is from week 1 until the end of week 13 (months 1 through 3).  To take care of yourself and your unborn baby, you will need to eat healthy meals, take medicines only if your doctor tells you to do so, and do activities that are safe for you and your baby.  Keep all follow-up visits as told by your doctor. This is important as your doctor will have to ensure that your baby is healthy and growing well. This information is not intended to replace advice given to you by your health care provider. Make sure you discuss any questions you have with your health care provider. Document Released: 01/31/2008 Document Revised: 08/22/2016 Document Reviewed: 08/22/2016 Elsevier Interactive Patient Education  2017 [employer]  

## 2018-01-01 LAB — URINALYSIS, ROUTINE W REFLEX MICROSCOPIC
BILIRUBIN UA: NEGATIVE
GLUCOSE, UA: NEGATIVE
KETONES UA: NEGATIVE
LEUKOCYTES UA: NEGATIVE
Nitrite, UA: NEGATIVE
PROTEIN UA: NEGATIVE
RBC UA: NEGATIVE
SPEC GRAV UA: 1.015 (ref 1.005–1.030)
Urobilinogen, Ur: 1 mg/dL (ref 0.2–1.0)
pH, UA: 6.5 (ref 5.0–7.5)

## 2018-01-01 LAB — CBC WITH DIFFERENTIAL/PLATELET
BASOS: 0 %
Basophils Absolute: 0 10*3/uL (ref 0.0–0.2)
EOS (ABSOLUTE): 0.2 10*3/uL (ref 0.0–0.4)
EOS: 2 %
HEMATOCRIT: 33.7 % — AB (ref 34.0–46.6)
HEMOGLOBIN: 10.7 g/dL — AB (ref 11.1–15.9)
IMMATURE GRANS (ABS): 0 10*3/uL (ref 0.0–0.1)
Immature Granulocytes: 1 %
LYMPHS: 29 %
Lymphocytes Absolute: 2.5 10*3/uL (ref 0.7–3.1)
MCH: 24.4 pg — ABNORMAL LOW (ref 26.6–33.0)
MCHC: 31.8 g/dL (ref 31.5–35.7)
MCV: 77 fL — ABNORMAL LOW (ref 79–97)
MONOCYTES: 7 %
Monocytes Absolute: 0.6 10*3/uL (ref 0.1–0.9)
Neutrophils Absolute: 5.3 10*3/uL (ref 1.4–7.0)
Neutrophils: 61 %
Platelets: 329 10*3/uL (ref 150–379)
RBC: 4.39 x10E6/uL (ref 3.77–5.28)
RDW: 16.4 % — ABNORMAL HIGH (ref 12.3–15.4)
WBC: 8.5 10*3/uL (ref 3.4–10.8)

## 2018-01-01 LAB — HIV ANTIBODY (ROUTINE TESTING W REFLEX): HIV SCREEN 4TH GENERATION: NONREACTIVE

## 2018-01-01 LAB — HEMOGLOBIN A1C
Est. average glucose Bld gHb Est-mCnc: 103 mg/dL
Hgb A1c MFr Bld: 5.2 % (ref 4.8–5.6)

## 2018-01-01 LAB — GC/CHLAMYDIA PROBE AMP
Chlamydia trachomatis, NAA: NEGATIVE
Neisseria gonorrhoeae by PCR: NEGATIVE

## 2018-01-01 LAB — RPR: RPR: NONREACTIVE

## 2018-01-01 LAB — RUBELLA SCREEN: RUBELLA: 2.93 {index} (ref >0.99)

## 2018-01-01 LAB — ABO AND RH: Rh Factor: POSITIVE

## 2018-01-01 LAB — HEPATITIS B SURFACE ANTIGEN: Hepatitis B Surface Ag: NEGATIVE

## 2018-01-01 LAB — TSH: TSH: 1.8 u[IU]/mL (ref 0.450–4.500)

## 2018-01-01 LAB — ANTIBODY SCREEN: Antibody Screen: NEGATIVE

## 2018-01-01 LAB — VARICELLA ZOSTER ANTIBODY, IGG: Varicella zoster IgG: 1442 index (ref >165)

## 2018-01-02 LAB — MONITOR DRUG PROFILE 14(MW)
AMPHETAMINE SCREEN URINE: NEGATIVE ng/mL
BARBITURATE SCREEN URINE: NEGATIVE ng/mL
BENZODIAZEPINE SCREEN, URINE: NEGATIVE ng/mL
Buprenorphine, Urine: NEGATIVE ng/mL
CANNABINOIDS UR QL SCN: NEGATIVE ng/mL
Cocaine (Metab) Scrn, Ur: NEGATIVE ng/mL
Creatinine(Crt), U: 69.3 mg/dL (ref 20.0–300.0)
Fentanyl, Urine: NEGATIVE pg/mL
Meperidine Screen, Urine: NEGATIVE ng/mL
Methadone Screen, Urine: NEGATIVE ng/mL
OXYCODONE+OXYMORPHONE UR QL SCN: NEGATIVE ng/mL
Opiate Scrn, Ur: NEGATIVE ng/mL
PH UR, DRUG SCRN: 6.4 (ref 4.5–8.9)
PROPOXYPHENE SCREEN URINE: NEGATIVE ng/mL
Phencyclidine Qn, Ur: NEGATIVE ng/mL
SPECIFIC GRAVITY: 1.017
TRAMADOL SCREEN, URINE: NEGATIVE ng/mL

## 2018-01-02 LAB — URINE CULTURE: Organism ID, Bacteria: NO GROWTH

## 2018-01-10 NOTE — Progress Notes (Signed)
I have reviewed the record and concur with patient management and plan.  Suhaylah Wampole, MD [facility]     

## 2018-01-15 ENCOUNTER — Ambulatory Visit (INDEPENDENT_AMBULATORY_CARE_PROVIDER_SITE_OTHER): Payer: BC Managed Care – PPO | Admitting: Obstetrics and Gynecology

## 2018-01-15 VITALS — BP 112/74 | HR 89 | Wt 265.0 lb

## 2018-01-15 DIAGNOSIS — Z98891 History of uterine scar from previous surgery: Secondary | ICD-10-CM

## 2018-01-15 DIAGNOSIS — D509 Iron deficiency anemia, unspecified: Secondary | ICD-10-CM

## 2018-01-15 DIAGNOSIS — Z3491 Encounter for supervision of normal pregnancy, unspecified, first trimester: Secondary | ICD-10-CM

## 2018-01-15 DIAGNOSIS — Z8742 Personal history of other diseases of the female genital tract: Secondary | ICD-10-CM

## 2018-01-15 DIAGNOSIS — E559 Vitamin D deficiency, unspecified: Secondary | ICD-10-CM

## 2018-01-15 DIAGNOSIS — O09299 Supervision of pregnancy with other poor reproductive or obstetric history, unspecified trimester: Secondary | ICD-10-CM

## 2018-01-15 DIAGNOSIS — O0991 Supervision of high risk pregnancy, unspecified, first trimester: Secondary | ICD-10-CM

## 2018-01-15 LAB — POCT URINALYSIS DIPSTICK
Bilirubin, UA: NEGATIVE
Glucose, UA: NEGATIVE
Ketones, UA: NEGATIVE
LEUKOCYTES UA: NEGATIVE
NITRITE UA: NEGATIVE
PROTEIN UA: POSITIVE — AB
RBC UA: NEGATIVE
SPEC GRAV UA: 1.01 (ref 1.010–1.025)
Urobilinogen, UA: 0.2 E.U./dL
pH, UA: 8.5 — AB (ref 5.0–8.0)

## 2018-01-15 MED ORDER — FERRALET 90 90-1 MG PO TABS: 1.0000 | ORAL_TABLET | Freq: Every day | ORAL | 6 refills | Status: DC

## 2018-01-15 NOTE — Progress Notes (Signed)
NOB-pt is present today for new ob physical. Pt stated that she has being taking iron pills and her iron level is not going up. Pt also was concerned about her Vit D levels and wondering if she should continue to take Vit D.

## 2018-01-15 NOTE — Progress Notes (Signed)
OBSTETRIC INITIAL PRENATAL VISIT  Subjective:    Deanna Newman is being seen today for her first obstetrical visit.  This is a planned pregnancy, assisted with ovulation induction medications (Metformin and Femara) for h/o secondary infertility. She is a G4P1011 female at [redacted]w[redacted]d gestation, Estimated Date of Delivery: 08/03/18 with last menstrual period 10/27/2017, consistent with 6 week sono. Her obstetrical history is significant for h/o infertility, h/o miscarriage, h/o mild pre-eclampsia, prior C-section x 1, morbid obesity. Relationship with FOB: spouse, living together. Patient does intend to breast feed. Pregnancy history fully reviewed.  Patient notes concerns today regarding Vitamin D levels and iron levels.  Is taking supplements, but notes iron level has not improved much.    OB History  Gravida Para Term Preterm AB Living  0 1 1  SAB TAB Ectopic Multiple Live Births  1 0 0 0 1    # Outcome Date GA Lbr Len/2nd Weight Sex Delivery Anes PTL Lv  4 Current           3 SAB 11/11/16 [redacted]w[redacted]d         2 Term 05/23/12 [redacted]w[redacted]d  8 lb 4 oz (3.742 kg) M CS-LTranv   LIV  1 Gravida             Gynecologic History:  Last pap smear was 10/24/2016.  Results were normal.  Has remote h/o abnormal pap smear in the past.  Denies history of STIs.    Past Medical History:  Diagnosis Date  . Anemia   . Migraine   . Vaginal Pap smear, abnormal 2010   followed by colposcopy     Family History  Problem Relation Age of Onset  . Skin cancer Mother   . GER disease Mother   . Diabetes Father   . COPD Father   . Asthma Father   . Hypertension Father   . Heart failure Father   . Hypertension Sister      Past Surgical History:  Procedure Laterality Date  . CESAREAN SECTION    . WISDOM TOOTH EXTRACTION       Social History   Socioeconomic History  . Marital status: Divorced    Spouse name: Not on file  . Number of children: Not on file  . Years of education: Not on file  .  Highest education level: Not on file  Occupational History  . Not on file  Social Needs  . Financial resource strain: Not on file  . Food insecurity:    Worry: Not on file    Inability: Not on file  . Transportation needs:    Medical: Not on file    Non-medical: Not on file  Tobacco Use  . Smoking status: Never Smoker  . Smokeless tobacco: Never Used  Substance and Sexual Activity  . Alcohol use: Not Currently    Comment: Occasionally   . Drug use: No  . Sexual activity: Yes    Birth control/protection: None  Lifestyle  . Physical activity:    Days per week: Not on file    Minutes per session: Not on file  . Stress: Not on file  Relationships  . Social connections:    Talks on phone: Not on file    Gets together: Not on file    Attends religious service: Not on file    Active member of club or organization: Not on file    Attends meetings of clubs or organizations: Not on file  Relationship status: Not on file  . Intimate partner violence:    Fear of current or ex partner: Not on file    Emotionally abused: Not on file    Physically abused: Not on file    Forced sexual activity: Not on file  Other Topics Concern  . Not on file  Social History Narrative  . Not on file     Current Outpatient Medications on File Prior to Visit  Medication Sig Dispense Refill  . Doxylamine-Pyridoxine (DICLEGIS) 10-10 MG TBEC Take 10 mg by mouth 4 (four) times daily as needed. 60 tablet 1  . Doxylamine-Pyridoxine ER (BONJESTA) 20-20 MG TBCR Take 1 tablet by mouth 2 (two) times daily. 60 tablet 3  . ferrous sulfate (FERROUSUL) 325 (65 FE) MG tablet Take 1 tablet (325 mg total) by mouth daily with breakfast. 30 tablet 2  . Prenatal Vit-Fe Fumarate-FA (PRENATAL MULTIVITAMIN) TABS tablet Take 1 tablet by mouth daily at 12 noon.    . Vitamin D, Ergocalciferol, (DRISDOL) 50000 units CAPS capsule Take 1 capsule (50,000 Units total) by mouth every 7 (seven) days. 12 capsule 0   No current  facility-administered medications on file prior to visit.      Allergies  Allergen Reactions  . Sumatriptan Anaphylaxis     Review of Systems General:Not Present- Fever, Weight Loss and Weight Gain. Skin:Not Present- Rash. HEENT:Not Present- Blurred Vision, Headache and Bleeding Gums. Respiratory:Not Present- Difficulty Breathing. Breast:Not Present- Breast Mass. Cardiovascular:Not Present- Chest Pain, Elevated Blood Pressure, Fainting / Blacking Out and Shortness of Breath. Gastrointestinal:Not Present- Abdominal Pain, Constipation, Nausea and Vomiting. Female Genitourinary:Not Present- Frequency, Painful Urination, Pelvic Pain, Vaginal Bleeding, Vaginal Discharge, Contractions, regular, Fetal Movements Decreased, Urinary Complaints and Vaginal Fluid. Musculoskeletal:Not Present- Back Pain and Leg Cramps. Neurological:Not Present- Dizziness. Psychiatric:Not Present- Depression.     Objective:   Blood pressure 112/74, pulse 89, weight 265 lb (120.2 kg), last menstrual period 10/27/2017, unknown if currently breastfeeding.  Body mass index is 42.77 kg/m.   General Appearance:    Alert, cooperative, no distress, appears stated age, morbidly obese  Head:    Normocephalic, without obvious abnormality, atraumatic  Eyes:    PERRL, conjunctiva/corneas clear, EOM's intact, both eyes  Ears:    Normal external ear canals, both ears  Nose:   Nares normal, septum midline, mucosa normal, no drainage or sinus tenderness  Throat:   Lips, mucosa, and tongue normal; teeth and gums normal  Neck:   Supple, symmetrical, trachea midline, no adenopathy; thyroid: no enlargement/tenderness/nodules; no carotid bruit or JVD  Back:     Symmetric, no curvature, ROM normal, no CVA tenderness  Lungs:     Clear to auscultation bilaterally, respirations unlabored  Chest Wall:    No tenderness or deformity   Heart:    Regular rate and rhythm, S1 and S2 normal, no murmur, rub or gallop  Breast  Exam:    No tenderness, masses, or nipple abnormality  Abdomen:     Soft, non-tender, bowel sounds active all four quadrants, no masses, no organomegaly.  FH difficult to assess due to body habitus.  FHT 161 bpm.  Genitalia:    Pelvic:external genitalia normal, vagina without lesions, discharge, or tenderness, rectovaginal septum  normal. Cervix normal in appearance, no cervical motion tenderness, no adnexal masses or tenderness.  Pregnancy positive findings: uterine enlargement: 11 wk size, nontender.   Rectal:    Normal external sphincter.  No hemorrhoids appreciated. Internal exam not done.   Extremities:   Extremities normal,  atraumatic, no cyanosis or edema  Pulses:   2+ and symmetric all extremities  Skin:   Skin color, texture, turgor normal, no rashes or lesions  Lymph nodes:   Cervical, supraclavicular, and axillary nodes normal  Neurologic:   CNII-XII intact, normal strength, sensation and reflexes throughout       Assessment:    Pregnancy at 11 and 3/7 weeks   H/o infertility  Morbid obesity H/o miscarriage H/o C-section x 1 H/o mild pre-eclampsia Vitamin D deficiency History of anemia  Plan:   1. Pregnancy at 11 weeks - Initial labs reviewed.  - Prenatal vitamins encouraged. - Problem list reviewed and updated. - New OB counseling:  The patient has been given an overview regarding routine prenatal care.   - Prenatal testing, optional genetic testing, and ultrasound use in pregnancy were reviewed. 1st trimester, 2nd trimester, and cell-free DNA testing discussed: undecided. To discuss next visit.  - Benefits of Breast Feeding were discussed. The patient is encouraged to consider nursing her baby post partum.  2. H/o infertility  - Patient currently pregnant with use of ovulation induction medications.   3. Morbid obesity - Recommendations regarding diet, weight gain, and exercise in pregnancy were given.Patient advised to gain no more than 15-20 lbs this pregnancy -  Needs early glucola  4. H/o miscarriage - Occurred 1 year ago. Current pregnancy viable on today's exam.   5. H/o C-section x 1 - Discussed TOLAC vs repeat C-section. Patient undecided, but currently leaning more towards TOLAC.  Can continue to discuss at future visits. Reason for C-section was failed IOL for mild pre-eclampsia in prior pregnancy.   6. H/o mild pre-eclampsia - Required IOL at 39 weeks.   - Will get baseline labs  7. H/o Vitamin D deficiency, currently taking prescription Vitamin D x 3 months. Is due for recheck. Will order.   8. H/o anemia - Began OTC iron tablets daily ~ 3 months ago.  Recheck at NOB visit not showing much change. Will increase to 1 tab BID, and will prescribe Colace for constipation.    Follow up in 4 weeks.  50% of 30 min visit spent on counseling and coordination of care.     Hildred Laser, MD [facility]

## 2018-01-16 DIAGNOSIS — O09299 Supervision of pregnancy with other poor reproductive or obstetric history, unspecified trimester: Secondary | ICD-10-CM | POA: Insufficient documentation

## 2018-01-16 DIAGNOSIS — Z98891 History of uterine scar from previous surgery: Secondary | ICD-10-CM | POA: Insufficient documentation

## 2018-01-16 LAB — VITAMIN D 25 HYDROXY (VIT D DEFICIENCY, FRACTURES): Vit D, 25-Hydroxy: 30.7 ng/mL (ref 30.0–100.0)

## 2018-01-23 ENCOUNTER — Other Ambulatory Visit: Payer: Self-pay

## 2018-01-23 MED ORDER — FERROUS SULFATE 325 (65 FE) MG PO TABS: 325.0000 mg | ORAL_TABLET | Freq: Two times a day (BID) | ORAL | 6 refills | Status: DC

## 2018-01-23 MED ORDER — DOCUSATE SODIUM 100 MG PO CAPS: 100.0000 mg | ORAL_CAPSULE | Freq: Two times a day (BID) | ORAL | 6 refills | Status: DC

## 2018-02-12 ENCOUNTER — Encounter: Payer: Self-pay | Admitting: Obstetrics and Gynecology

## 2018-02-12 ENCOUNTER — Ambulatory Visit (INDEPENDENT_AMBULATORY_CARE_PROVIDER_SITE_OTHER): Payer: BC Managed Care – PPO | Admitting: Obstetrics and Gynecology

## 2018-02-12 VITALS — BP 112/75 | HR 116 | Wt 272.1 lb

## 2018-02-12 DIAGNOSIS — O0992 Supervision of high risk pregnancy, unspecified, second trimester: Secondary | ICD-10-CM

## 2018-02-12 DIAGNOSIS — R638 Other symptoms and signs concerning food and fluid intake: Secondary | ICD-10-CM

## 2018-02-12 LAB — POCT URINALYSIS DIPSTICK
BILIRUBIN UA: NEGATIVE
Blood, UA: NEGATIVE
Glucose, UA: NEGATIVE
KETONES UA: NEGATIVE
Leukocytes, UA: NEGATIVE
NITRITE UA: NEGATIVE
ODOR: NEGATIVE
PH UA: 6 (ref 5.0–8.0)
PROTEIN UA: NEGATIVE
SPEC GRAV UA: 1.025 (ref 1.010–1.025)
UROBILINOGEN UA: 0.2 U/dL

## 2018-02-12 NOTE — Progress Notes (Signed)
ROB- no complaints. Declines genetic testing.

## 2018-02-12 NOTE — Progress Notes (Signed)
ROB: Patient feeling much better no further nausea and vomiting.  Discussed quad screen patient declined at this time.  Discussed BMI over 45 and anesthesia consult likely to occur with next visit.  Again stressed need for control of weight during the pregnancy.  1 hour GCT, PIH labs, FAS next visit.

## 2018-03-04 ENCOUNTER — Other Ambulatory Visit: Payer: Self-pay | Admitting: Obstetrics and Gynecology

## 2018-03-04 DIAGNOSIS — Z3689 Encounter for other specified antenatal screening: Secondary | ICD-10-CM

## 2018-03-15 ENCOUNTER — Ambulatory Visit: Payer: BC Managed Care – PPO

## 2018-03-15 ENCOUNTER — Ambulatory Visit (INDEPENDENT_AMBULATORY_CARE_PROVIDER_SITE_OTHER): Payer: BC Managed Care – PPO | Admitting: Obstetrics and Gynecology

## 2018-03-15 ENCOUNTER — Ambulatory Visit (INDEPENDENT_AMBULATORY_CARE_PROVIDER_SITE_OTHER): Payer: BC Managed Care – PPO

## 2018-03-15 VITALS — BP 126/83 | HR 105 | Wt 278.7 lb

## 2018-03-15 DIAGNOSIS — Z363 Encounter for antenatal screening for malformations: Secondary | ICD-10-CM | POA: Diagnosis not present

## 2018-03-15 DIAGNOSIS — Z3A20 20 weeks gestation of pregnancy: Secondary | ICD-10-CM

## 2018-03-15 DIAGNOSIS — O99210 Obesity complicating pregnancy, unspecified trimester: Secondary | ICD-10-CM

## 2018-03-15 DIAGNOSIS — O09292 Supervision of pregnancy with other poor reproductive or obstetric history, second trimester: Secondary | ICD-10-CM | POA: Diagnosis not present

## 2018-03-15 DIAGNOSIS — Z8759 Personal history of other complications of pregnancy, childbirth and the puerperium: Secondary | ICD-10-CM

## 2018-03-15 DIAGNOSIS — Z3689 Encounter for other specified antenatal screening: Secondary | ICD-10-CM

## 2018-03-15 DIAGNOSIS — O99212 Obesity complicating pregnancy, second trimester: Secondary | ICD-10-CM | POA: Diagnosis not present

## 2018-03-15 DIAGNOSIS — Z3482 Encounter for supervision of other normal pregnancy, second trimester: Secondary | ICD-10-CM

## 2018-03-15 LAB — POCT URINALYSIS DIPSTICK
Bilirubin, UA: NEGATIVE
Blood, UA: NEGATIVE
GLUCOSE UA: NEGATIVE
KETONES UA: NEGATIVE
LEUKOCYTES UA: NEGATIVE
Nitrite, UA: NEGATIVE
PROTEIN UA: POSITIVE — AB
Spec Grav, UA: 1.02 (ref 1.010–1.025)
Urobilinogen, UA: 0.2 E.U./dL
pH, UA: 6.5 (ref 5.0–8.0)

## 2018-03-15 NOTE — Progress Notes (Addendum)
ROB: Is concerned regarding her risk of pre-eclampsia. Is currently taking aspirin as prescribed. Discussed all concerns.  Also discussed weight gain in pregnancy and remaining as close to pregravid BMI as possible, as well as exercise in pregnancy. Will refer to nutritionist for assistance in weight management. For early glucola today, will also check baseline Creatinine for h/o pre-eclampsia. S/p normal anatomy scan today. RTC in 4 weeks.  The patient has Medicaid.  [facility] Medicaid Risk Screening Form completed today

## 2018-03-15 NOTE — Progress Notes (Signed)
ROB-pt stated that she is doing well but not that her ankles and feet has been swelling. No other complaints.

## 2018-03-16 LAB — CBC
HEMOGLOBIN: 10.6 g/dL — AB (ref 11.1–15.9)
Hematocrit: 33.2 % — ABNORMAL LOW (ref 34.0–46.6)
MCH: 24.9 pg — ABNORMAL LOW (ref 26.6–33.0)
MCHC: 31.9 g/dL (ref 31.5–35.7)
MCV: 78 fL — ABNORMAL LOW (ref 79–97)
PLATELETS: 302 10*3/uL (ref 150–450)
RBC: 4.25 x10E6/uL (ref 3.77–5.28)
RDW: 14.1 % (ref 12.3–15.4)
WBC: 8.8 10*3/uL (ref 3.4–10.8)

## 2018-03-16 LAB — RPR: RPR: NONREACTIVE

## 2018-03-16 LAB — GLUCOSE, 1 HOUR GESTATIONAL: GESTATIONAL DIABETES SCREEN: 120 mg/dL (ref 65–139)

## 2018-03-21 ENCOUNTER — Encounter: Payer: Self-pay | Admitting: Obstetrics and Gynecology

## 2018-04-01 ENCOUNTER — Encounter: Payer: Self-pay | Admitting: Dietician

## 2018-04-01 ENCOUNTER — Encounter: Payer: BC Managed Care – PPO | Attending: Obstetrics and Gynecology | Admitting: Dietician

## 2018-04-01 VITALS — Ht 65.0 in | Wt 280.3 lb

## 2018-04-01 DIAGNOSIS — Z6841 Body Mass Index (BMI) 40.0 and over, adult: Secondary | ICD-10-CM

## 2018-04-01 DIAGNOSIS — Z3A22 22 weeks gestation of pregnancy: Secondary | ICD-10-CM

## 2018-04-01 NOTE — Patient Instructions (Signed)
   Increase intake of low-carb veggies by increasing portions to 1 cup (fist-size) or more, and/or eating veggies more often.   Add some walking for light exercise, start with at least 15 minutes and gradually increase as able.   Continue to choose lean meats and healthy fruit snacks. Include a protein source with most meals and some snacks.

## 2018-04-01 NOTE — Progress Notes (Signed)
Medical Nutrition Therapy: Visit start time: 0930  end time: 1030  Assessment:  Diagnosis: obesity during pregnancy Past medical history: patient reports preeclampsia with first pregnancy, miscarriage last year Psychosocial issues/ stress concerns: none  Preferred learning method:  . Hands-on   Current weight: 280.3lbs  Height: 5'5" Medications, supplements: reconciled list in medical record  Progress and evaluation: Patient reports gain of about 15lbs so far during pregnancy at [redacted] weeks gestation, mostly during second trimester.  She reports losing 80lbs after first pregnancy, but regained weight after having a miscarriage. She had nausea during first trimester, now improved. Goal is to keep weight under 290lbs, BMI under 50. Patient voices concern with keeping a healthy pregnancy due to her previous miscarriage.   Physical activity: none yet, plans to begin walking  Dietary Intake:  Usual eating pattern includes 3 meals and 1-2 snacks per day. Dining out frequency: 3-5 meals per week.  Breakfast: 7:15 eggs, chorizo sausage, or oatmeal if home; or biscuit/ egg sandwich out Snack: 10am fruit-- orange, banana, occ apple sometimes with peanut butter Lunch: 12:30-1pm leftovers; or takeout -- salads, etc Snack: 3pm fruit Supper: 6-7pm last week-- lasagna, chicken with salsa; salad; usually chicken, pasta occasionally Snack: none Beverages: water; decaf coffee 3x a week; occ soda on weekend  Nutrition Care Education: Topics covered: pregnancy weight control  Basic nutrition: basic food groups, appropriate nutrient balance, appropriate meal and snack schedule, general nutrition guidelines    Weight control: general energy and protein needs during pregnancy; importance of choosing low fat and low sugar foods/ beverages and limiting added fats and sugars; basic meal planning and appropriate food portions; using low-carb veggies and lean proteins to promote fulness vs large carb portions;  benefits of tracking food intake; role of physical activity Advanced nutrition: dining out--importance of controlling portions Other: Reviewed food safety guidelines for pregnancy    Nutritional Diagnosis:  [place]-3.3 Overweight/obesity As related to excess calories prior to pregnancy.  As evidenced by patient in second trimester of pregnancy with 15lb weight gain, BMI 46.6.  Intervention: Instruction as noted above.   Set goals with direction from patient.    She did not feel she needs RD follow-up at this time, but will schedule later if needed.  Education Materials given:  . Plate Planner with food lists . Food Safety . Goals/ instructions   Learner/ who was taught:  . Patient   Level of understanding: Marland Kitchen. Verbalizes/ demonstrates competency   Demonstrated degree of understanding via:   Teach back Learning barriers: . None  Willingness to learn/ readiness for change: . Eager, change in progress  Monitoring and Evaluation:  Dietary intake, exercise, and body weight      follow up: prn

## 2018-04-16 ENCOUNTER — Encounter: Payer: Self-pay | Admitting: Obstetrics and Gynecology

## 2018-04-16 ENCOUNTER — Ambulatory Visit (INDEPENDENT_AMBULATORY_CARE_PROVIDER_SITE_OTHER): Payer: BC Managed Care – PPO | Admitting: Obstetrics and Gynecology

## 2018-04-16 VITALS — BP 131/85 | HR 112 | Wt 285.0 lb

## 2018-04-16 DIAGNOSIS — Z3482 Encounter for supervision of other normal pregnancy, second trimester: Secondary | ICD-10-CM | POA: Diagnosis not present

## 2018-04-16 LAB — POCT URINALYSIS DIPSTICK OB
Bilirubin, UA: NEGATIVE
Blood, UA: NEGATIVE
Glucose, UA: NEGATIVE — AB
KETONES UA: NEGATIVE
LEUKOCYTES UA: NEGATIVE
NITRITE UA: NEGATIVE
PH UA: 7.5 (ref 5.0–8.0)
POC,PROTEIN,UA: NEGATIVE
SPEC GRAV UA: 1.01 (ref 1.010–1.025)
UROBILINOGEN UA: 0.2 U/dL

## 2018-04-16 NOTE — Progress Notes (Signed)
Pt presents today for routine prenatal check up. Pt states she has no concerns at this time.

## 2018-04-16 NOTE — Progress Notes (Signed)
ROB: Patient doing well without complaint.  She went to see the nutritionist.  We discussed VBAC today as she had many questions.  Still not sure.  Reports daily fetal movement.  Needs 1 hour GCT next visit.

## 2018-05-14 ENCOUNTER — Other Ambulatory Visit (INDEPENDENT_AMBULATORY_CARE_PROVIDER_SITE_OTHER): Payer: BC Managed Care – PPO

## 2018-05-14 ENCOUNTER — Other Ambulatory Visit: Payer: BC Managed Care – PPO

## 2018-05-14 ENCOUNTER — Ambulatory Visit (INDEPENDENT_AMBULATORY_CARE_PROVIDER_SITE_OTHER): Payer: BC Managed Care – PPO | Admitting: Obstetrics and Gynecology

## 2018-05-14 VITALS — BP 123/82 | HR 94 | Wt 293.1 lb

## 2018-05-14 DIAGNOSIS — Z3482 Encounter for supervision of other normal pregnancy, second trimester: Secondary | ICD-10-CM

## 2018-05-14 DIAGNOSIS — Z3A29 29 weeks gestation of pregnancy: Secondary | ICD-10-CM

## 2018-05-14 DIAGNOSIS — O99213 Obesity complicating pregnancy, third trimester: Secondary | ICD-10-CM | POA: Diagnosis not present

## 2018-05-14 DIAGNOSIS — O0993 Supervision of high risk pregnancy, unspecified, third trimester: Secondary | ICD-10-CM

## 2018-05-14 DIAGNOSIS — Z3A28 28 weeks gestation of pregnancy: Secondary | ICD-10-CM

## 2018-05-14 DIAGNOSIS — O2602 Excessive weight gain in pregnancy, second trimester: Secondary | ICD-10-CM

## 2018-05-14 DIAGNOSIS — O34219 Maternal care for unspecified type scar from previous cesarean delivery: Secondary | ICD-10-CM

## 2018-05-14 DIAGNOSIS — Z23 Encounter for immunization: Secondary | ICD-10-CM | POA: Diagnosis not present

## 2018-05-14 DIAGNOSIS — Z98891 History of uterine scar from previous surgery: Secondary | ICD-10-CM

## 2018-05-14 DIAGNOSIS — O99212 Obesity complicating pregnancy, second trimester: Secondary | ICD-10-CM

## 2018-05-14 LAB — POCT URINALYSIS DIPSTICK OB
Bilirubin, UA: NEGATIVE
GLUCOSE, UA: NEGATIVE
KETONES UA: NEGATIVE
LEUKOCYTES UA: NEGATIVE
Nitrite, UA: NEGATIVE
POC,PROTEIN,UA: NEGATIVE
RBC UA: NEGATIVE
SPEC GRAV UA: 1.015 (ref 1.010–1.025)
Urobilinogen, UA: 0.2 E.U./dL
pH, UA: 6.5 (ref 5.0–8.0)

## 2018-05-14 MED ORDER — TETANUS-DIPHTH-ACELL PERTUSSIS 5-2.5-18.5 LF-MCG/0.5 IM SUSP
0.5000 mL | Freq: Once | INTRAMUSCULAR | Status: AC
  Administered 2018-05-14: 0.5 mL via INTRAMUSCULAR

## 2018-05-14 NOTE — Progress Notes (Signed)
ROB: Notes round ligament pain.  Discussed management options. For 28 week labs today.  Desires to breastfeed.  Unsure of desires for contraception. Handout given. For Tdap today and flu vaccine today. signed blood consent.  For growth scan today for morbid obesity. Reiterated no further weight gain in pregnancy, has seen nutritionist.  Strongly recommended following recommendations for dietary modification and incorporating exercise as TWG is now 28 lbs and BMI is 48. Patient notes understanding. Discussed TOLAC vs repeat C/S again, patient notes she is still leaning toward repeat C-section but is still not completely certain.  Can discuss at future visits. RTC in 2 weeks.

## 2018-05-14 NOTE — Progress Notes (Signed)
  ROB-PT stated that when she walks she has a lot of pressure in the vaginal area. pt stated that she has a pimple on the vaginal area but she think it has went away.  No other complaints.

## 2018-05-15 LAB — GLUCOSE, 1 HOUR GESTATIONAL: Gestational Diabetes Screen: 98 mg/dL (ref 65–139)

## 2018-05-27 NOTE — Progress Notes (Signed)
Pt presents today for ROB. Pt is doing well and has no concerns. 

## 2018-05-28 ENCOUNTER — Ambulatory Visit (INDEPENDENT_AMBULATORY_CARE_PROVIDER_SITE_OTHER): Payer: BC Managed Care – PPO | Admitting: Obstetrics and Gynecology

## 2018-05-28 VITALS — BP 114/79 | HR 102 | Wt 298.0 lb

## 2018-05-28 DIAGNOSIS — Z98891 History of uterine scar from previous surgery: Secondary | ICD-10-CM

## 2018-05-28 DIAGNOSIS — O99213 Obesity complicating pregnancy, third trimester: Secondary | ICD-10-CM

## 2018-05-28 DIAGNOSIS — O0993 Supervision of high risk pregnancy, unspecified, third trimester: Secondary | ICD-10-CM

## 2018-05-28 DIAGNOSIS — O34219 Maternal care for unspecified type scar from previous cesarean delivery: Secondary | ICD-10-CM

## 2018-05-28 LAB — POCT URINALYSIS DIPSTICK OB
Bilirubin, UA: NEGATIVE
Glucose, UA: NEGATIVE
Ketones, UA: NEGATIVE
LEUKOCYTES UA: NEGATIVE
NITRITE UA: NEGATIVE
PROTEIN: NEGATIVE
RBC UA: NEGATIVE
Spec Grav, UA: 1.01 (ref 1.010–1.025)
Urobilinogen, UA: 0.2 E.U./dL
pH, UA: 7 (ref 5.0–8.0)

## 2018-05-28 NOTE — Progress Notes (Signed)
ROB: Patient has not reached 50 BMI.  We have discussed the changing landscape of BMI and anesthesia at [facility] and we will keep her informed as the pregnancy progresses.  Patient has decided upon repeat cesarean delivery.  CBC/RPR today.  

## 2018-05-28 NOTE — Progress Notes (Deleted)
ROB: Patient has not reached 50 BMI.  We have discussed the changing landscape of BMI and anesthesia at [facility] and we will keep her informed as the pregnancy progresses.  Patient has decided upon repeat cesarean delivery.  CBC/RPR today.

## 2018-05-29 LAB — CBC
Hematocrit: 35.6 % (ref 34.0–46.6)
Hemoglobin: 11.6 g/dL (ref 11.1–15.9)
MCH: 25.6 pg — AB (ref 26.6–33.0)
MCHC: 32.6 g/dL (ref 31.5–35.7)
MCV: 78 fL — AB (ref 79–97)
Platelets: 330 10*3/uL (ref 150–450)
RBC: 4.54 x10E6/uL (ref 3.77–5.28)
RDW: 14.6 % (ref 12.3–15.4)
WBC: 9 10*3/uL (ref 3.4–10.8)

## 2018-05-29 LAB — RPR: RPR Ser Ql: NONREACTIVE

## 2018-06-08 ENCOUNTER — Other Ambulatory Visit: Payer: Self-pay | Admitting: Obstetrics and Gynecology

## 2018-06-08 DIAGNOSIS — O99213 Obesity complicating pregnancy, third trimester: Secondary | ICD-10-CM

## 2018-06-11 ENCOUNTER — Other Ambulatory Visit: Payer: Self-pay | Admitting: Obstetrics and Gynecology

## 2018-06-11 DIAGNOSIS — O99213 Obesity complicating pregnancy, third trimester: Secondary | ICD-10-CM

## 2018-06-12 ENCOUNTER — Ambulatory Visit (INDEPENDENT_AMBULATORY_CARE_PROVIDER_SITE_OTHER): Payer: BC Managed Care – PPO | Admitting: Obstetrics and Gynecology

## 2018-06-12 ENCOUNTER — Encounter: Payer: Self-pay | Admitting: Obstetrics and Gynecology

## 2018-06-12 ENCOUNTER — Ambulatory Visit (INDEPENDENT_AMBULATORY_CARE_PROVIDER_SITE_OTHER): Payer: BC Managed Care – PPO

## 2018-06-12 VITALS — BP 121/93 | HR 103 | Ht 65.0 in | Wt 300.1 lb

## 2018-06-12 DIAGNOSIS — Z98891 History of uterine scar from previous surgery: Secondary | ICD-10-CM

## 2018-06-12 DIAGNOSIS — O99213 Obesity complicating pregnancy, third trimester: Secondary | ICD-10-CM

## 2018-06-12 DIAGNOSIS — Z3A33 33 weeks gestation of pregnancy: Secondary | ICD-10-CM

## 2018-06-12 DIAGNOSIS — O34219 Maternal care for unspecified type scar from previous cesarean delivery: Secondary | ICD-10-CM

## 2018-06-12 DIAGNOSIS — O0993 Supervision of high risk pregnancy, unspecified, third trimester: Secondary | ICD-10-CM

## 2018-06-12 LAB — POCT URINALYSIS DIPSTICK OB
Bilirubin, UA: NEGATIVE
Blood, UA: NEGATIVE
Glucose, UA: NEGATIVE
Ketones, UA: NEGATIVE
LEUKOCYTES UA: NEGATIVE
NITRITE UA: NEGATIVE
PROTEIN: NEGATIVE
SPEC GRAV UA: 1.01 (ref 1.010–1.025)
Urobilinogen, UA: 0.2 E.U./dL
pH, UA: 7 (ref 5.0–8.0)

## 2018-06-12 NOTE — Progress Notes (Signed)
   ROB-PT stated that she is doing well, but has two small red places on her body one on her rt arm and one on her abd pt is concerned about those area.

## 2018-06-12 NOTE — Progress Notes (Addendum)
ROB: Patient complains of 2 reddened areas that have increased in size, 1 on her arm, the other on her abdomen.  2 small [place] angiomas noted.  Reassured that this is not abnormal, and should not cause any issues during the pregnancy or thereafter. Is s/p growth scan today for morbid obesity. Growth at 52%ile (4 lb 11 oz). Will repeat again at ~37 weeks.  Discussed repeat C-section, as patient should now be able to continue her care and delivery at [facility] despite her BMI.  Patient desires surgery December 4th (patient request)  Will schedule. Patient will need Anesthesia consult. Will place referral. RTC in 2 weeks.

## 2018-06-17 ENCOUNTER — Telehealth: Payer: Self-pay | Admitting: Obstetrics and Gynecology
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
## 2018-06-25 ENCOUNTER — Encounter: Payer: Self-pay | Admitting: Obstetrics and Gynecology

## 2018-06-25 ENCOUNTER — Ambulatory Visit (INDEPENDENT_AMBULATORY_CARE_PROVIDER_SITE_OTHER): Payer: BC Managed Care – PPO | Admitting: Obstetrics and Gynecology

## 2018-06-25 VITALS — BP 119/89 | HR 106 | Wt 302.0 lb

## 2018-06-25 DIAGNOSIS — Z98891 History of uterine scar from previous surgery: Secondary | ICD-10-CM

## 2018-06-25 DIAGNOSIS — O34219 Maternal care for unspecified type scar from previous cesarean delivery: Secondary | ICD-10-CM

## 2018-06-25 DIAGNOSIS — O99213 Obesity complicating pregnancy, third trimester: Secondary | ICD-10-CM

## 2018-06-25 DIAGNOSIS — O0993 Supervision of high risk pregnancy, unspecified, third trimester: Secondary | ICD-10-CM

## 2018-06-25 LAB — POCT URINALYSIS DIPSTICK OB
BILIRUBIN UA: NEGATIVE
GLUCOSE, UA: NEGATIVE
KETONES UA: NEGATIVE
Leukocytes, UA: NEGATIVE
Nitrite, UA: NEGATIVE
POC,PROTEIN,UA: NEGATIVE
RBC UA: NEGATIVE
SPEC GRAV UA: 1.01 (ref 1.010–1.025)
Urobilinogen, UA: 0.2 E.U./dL
pH, UA: 6 (ref 5.0–8.0)
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
## 2018-06-27 ENCOUNTER — Encounter
Admission: RE | Admit: 2018-06-27 | Discharge: 2018-06-27 | Disposition: A | Discharge status: Home / Self Care | Payer: BC Managed Care – PPO | Source: Ambulatory Visit | Attending: Anesthesiology | Admitting: Anesthesiology

## 2018-06-27 ENCOUNTER — Encounter: Payer: BC Managed Care – PPO | Admitting: Obstetrics and Gynecology
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
## 2018-06-28 ENCOUNTER — Telehealth: Payer: Self-pay | Admitting: Obstetrics and Gynecology

## 2018-06-28 NOTE — Telephone Encounter (Signed)
The patient called and stated that she needs to speak a nurse in regards to her needing documentation of a note for her insurance. The patient is hoping to receive a call back today if possible. Please advise.
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
## 2018-07-03 ENCOUNTER — Ambulatory Visit (INDEPENDENT_AMBULATORY_CARE_PROVIDER_SITE_OTHER): Payer: BC Managed Care – PPO | Admitting: Obstetrics and Gynecology

## 2018-07-03 ENCOUNTER — Encounter: Payer: Self-pay | Admitting: Obstetrics and Gynecology

## 2018-07-03 VITALS — BP 123/83 | HR 128 | Wt 305.5 lb

## 2018-07-03 DIAGNOSIS — Z3A36 36 weeks gestation of pregnancy: Secondary | ICD-10-CM

## 2018-07-03 DIAGNOSIS — O99213 Obesity complicating pregnancy, third trimester: Secondary | ICD-10-CM

## 2018-07-03 DIAGNOSIS — Z98891 History of uterine scar from previous surgery: Secondary | ICD-10-CM

## 2018-07-03 DIAGNOSIS — O34219 Maternal care for unspecified type scar from previous cesarean delivery: Secondary | ICD-10-CM

## 2018-07-03 DIAGNOSIS — O0993 Supervision of high risk pregnancy, unspecified, third trimester: Secondary | ICD-10-CM

## 2018-07-03 DIAGNOSIS — Z6841 Body Mass Index (BMI) 40.0 and over, adult: Secondary | ICD-10-CM

## 2018-07-03 LAB — POCT URINALYSIS DIPSTICK OB
Bilirubin, UA: NEGATIVE
Blood, UA: NEGATIVE
Glucose, UA: NEGATIVE
Ketones, UA: NEGATIVE
LEUKOCYTES UA: NEGATIVE
NITRITE UA: NEGATIVE
PH UA: 6 (ref 5.0–8.0)
SPEC GRAV UA: 1.01 (ref 1.010–1.025)
UROBILINOGEN UA: 0.2 U/dL

## 2018-07-03 NOTE — Progress Notes (Signed)
ROB-pt is present today for normal routine prenatal visit. Pt stated that she is doing well no complaints. 36 weeks cultures done today.

## 2018-07-03 NOTE — Progress Notes (Signed)
ROB: Notes that she is becoming more nervous about her upcoming C-section. Pulse repeated, 104 (down from 128). Discussed C-section further. Cultures performed today.  RTC in 1 week. For growth scan and to begin weekly NSTs starting next week.

## 2018-07-03 NOTE — Patient Instructions (Signed)
Nonstress Test The nonstress test is a procedure that monitors the fetus's heartbeat. The test will monitor the heartbeat when the fetus is at rest and while the fetus is moving. In a healthy fetus, there will be an increase in fetal heart rate when the fetus moves or kicks. The heart rate will decrease at rest. This test helps determine if the fetus is healthy. Your health care provider will look at a number of patterns in the heart rate tracing to make sure your baby is thriving. If there is concern, your health care provider may order additional tests or may suggest another course of action. This test is often done in the third trimester and can help determine if an early delivery is needed and safe. Common reasons to have this test are:  You are past your due date.  You have a high-risk pregnancy.  You are feeling less movement than normal.  You have lost a pregnancy in the past.  Your health care provider suspects fetal growth problems.  You have too much or too little amniotic fluid.  What happens before the procedure?  Eat a meal right before the test or as directed by your health care provider. Food may help stimulate fetal movements.  Use the restroom right before the test. What happens during the procedure?  Two belts will be placed around your abdomen. These belts have monitors attached to them. One records the fetal heart rate and the other records uterine contractions.  You may be asked to lie down on your side or to stay sitting upright.  You may be given a button to press when you feel movement.  The fetal heartbeat is listened to and watched on a screen. The heartbeat is recorded on a sheet of paper.  If the fetus seems to be sleeping, you may be asked to drink some juice or soda, gently press your abdomen, or make some noise to wake the fetus. What happens after the procedure? Your health care provider will discuss the test results with you and make recommendations  for the near future.  This information is not intended to replace advice given to you by your health care provider. Make sure you discuss any questions you have with your health care provider. This information is not intended to replace advice given to you by your health care provider. Make sure you discuss any questions you have with your health care provider. Document Released: 08/04/2002 Document Revised: 07/14/2016 Document Reviewed: 09/17/2012 Elsevier Interactive Patient Education  2018 [employer]  

## 2018-07-05 LAB — GC/CHLAMYDIA PROBE AMP
Chlamydia trachomatis, NAA: NEGATIVE
Neisseria gonorrhoeae by PCR: NEGATIVE

## 2018-07-05 LAB — STREP GP B NAA: STREP GROUP B AG: POSITIVE — AB

## 2018-07-05 NOTE — Telephone Encounter (Signed)
Pt was seen in the office on 07/03/18 and she picked up her letter then from [facility].

## 2018-07-09 ENCOUNTER — Ambulatory Visit: Payer: BC Managed Care – PPO

## 2018-07-09 ENCOUNTER — Ambulatory Visit (INDEPENDENT_AMBULATORY_CARE_PROVIDER_SITE_OTHER): Payer: BC Managed Care – PPO | Admitting: Family Medicine

## 2018-07-09 ENCOUNTER — Encounter: Payer: Self-pay | Admitting: Family Medicine

## 2018-07-09 ENCOUNTER — Ambulatory Visit (INDEPENDENT_AMBULATORY_CARE_PROVIDER_SITE_OTHER): Payer: BC Managed Care – PPO | Admitting: Obstetrics and Gynecology

## 2018-07-09 ENCOUNTER — Encounter: Payer: Self-pay | Admitting: Obstetrics and Gynecology

## 2018-07-09 ENCOUNTER — Ambulatory Visit (INDEPENDENT_AMBULATORY_CARE_PROVIDER_SITE_OTHER): Payer: BC Managed Care – PPO

## 2018-07-09 VITALS — BP 122/79 | HR 105 | Temp 98.3°F | Ht 65.0 in | Wt 308.6 lb

## 2018-07-09 VITALS — BP 128/91 | HR 134 | Wt 309.0 lb

## 2018-07-09 DIAGNOSIS — O34219 Maternal care for unspecified type scar from previous cesarean delivery: Secondary | ICD-10-CM | POA: Diagnosis not present

## 2018-07-09 DIAGNOSIS — Z3A37 37 weeks gestation of pregnancy: Secondary | ICD-10-CM | POA: Diagnosis not present

## 2018-07-09 DIAGNOSIS — K219 Gastro-esophageal reflux disease without esophagitis: Secondary | ICD-10-CM | POA: Diagnosis not present

## 2018-07-09 DIAGNOSIS — Z362 Encounter for other antenatal screening follow-up: Secondary | ICD-10-CM

## 2018-07-09 DIAGNOSIS — J029 Acute pharyngitis, unspecified: Secondary | ICD-10-CM

## 2018-07-09 DIAGNOSIS — J4520 Mild intermittent asthma, uncomplicated: Secondary | ICD-10-CM | POA: Diagnosis not present

## 2018-07-09 DIAGNOSIS — O99213 Obesity complicating pregnancy, third trimester: Secondary | ICD-10-CM | POA: Diagnosis not present

## 2018-07-09 DIAGNOSIS — Z6841 Body Mass Index (BMI) 40.0 and over, adult: Secondary | ICD-10-CM

## 2018-07-09 DIAGNOSIS — O0993 Supervision of high risk pregnancy, unspecified, third trimester: Secondary | ICD-10-CM

## 2018-07-09 DIAGNOSIS — O09299 Supervision of pregnancy with other poor reproductive or obstetric history, unspecified trimester: Secondary | ICD-10-CM

## 2018-07-09 DIAGNOSIS — D509 Iron deficiency anemia, unspecified: Secondary | ICD-10-CM | POA: Diagnosis not present

## 2018-07-09 DIAGNOSIS — Z98891 History of uterine scar from previous surgery: Secondary | ICD-10-CM

## 2018-07-09 LAB — POCT URINALYSIS DIPSTICK OB
Bilirubin, UA: NEGATIVE
GLUCOSE, UA: NEGATIVE
Ketones, UA: NEGATIVE
LEUKOCYTES UA: NEGATIVE
NITRITE UA: NEGATIVE
PROTEIN: NEGATIVE
RBC UA: NEGATIVE
SPEC GRAV UA: 1.015 (ref 1.010–1.025)
Urobilinogen, UA: 0.2 E.U./dL
pH, UA: 6 (ref 5.0–8.0)

## 2018-07-09 MED ORDER — ALBUTEROL SULFATE HFA 108 (90 BASE) MCG/ACT IN AERS: 2.0000 | INHALATION_SPRAY | Freq: Four times a day (QID) | RESPIRATORY_TRACT | 2 refills | Status: DC | PRN

## 2018-07-09 NOTE — Patient Instructions (Signed)

## 2018-07-09 NOTE — Assessment & Plan Note (Signed)
Currently well controlled without any recent exacerbations Uncomplicated Albuterol prescribed to use as needed

## 2018-07-09 NOTE — Assessment & Plan Note (Signed)
Reviewed recent CBC Continue iron supplement Will need to monitor post-partum

## 2018-07-09 NOTE — Assessment & Plan Note (Signed)
Followed by GYN Continue ASA Watch BP
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
## 2018-07-09 NOTE — Assessment & Plan Note (Signed)
Likely related to pregnancy Discussed that H2 blockers can be taken during pregnancy if needed Suspect cough and sore throat are related to GERD

## 2018-07-09 NOTE — Progress Notes (Signed)
Pt here for NST and ROB. Pt is feeling well and has no concerns.

## 2018-07-09 NOTE — Progress Notes (Signed)
ROB: No complaints no further questions regarding C-section. NONSTRESS TEST INTERPRETATION  INDICATIONS: Obesity FHR baseline: 135 RESULTS:  reactive COMMENTS:   PLAN: 1. Continue fetal kick counts as directed. 2. Continue antepartum testing as scheduled.  Elonda Huskyavid J. Shadonna Benedick, M.D. 07/09/2018 3:52 PM

## 2018-07-10 ENCOUNTER — Other Ambulatory Visit: Payer: Self-pay

## 2018-07-10 MED ORDER — BREAST PUMP MISC: 0 refills | Status: DC

## 2018-07-16 ENCOUNTER — Encounter: Payer: Self-pay | Admitting: Obstetrics and Gynecology

## 2018-07-16 ENCOUNTER — Other Ambulatory Visit: Payer: BC Managed Care – PPO

## 2018-07-16 ENCOUNTER — Ambulatory Visit (INDEPENDENT_AMBULATORY_CARE_PROVIDER_SITE_OTHER): Payer: BC Managed Care – PPO | Admitting: Obstetrics and Gynecology

## 2018-07-16 VITALS — BP 122/88 | HR 106 | Wt 308.5 lb

## 2018-07-16 DIAGNOSIS — Z98891 History of uterine scar from previous surgery: Secondary | ICD-10-CM

## 2018-07-16 DIAGNOSIS — Z3483 Encounter for supervision of other normal pregnancy, third trimester: Secondary | ICD-10-CM

## 2018-07-16 DIAGNOSIS — O34219 Maternal care for unspecified type scar from previous cesarean delivery: Secondary | ICD-10-CM

## 2018-07-16 DIAGNOSIS — O99213 Obesity complicating pregnancy, third trimester: Secondary | ICD-10-CM | POA: Diagnosis not present

## 2018-07-16 DIAGNOSIS — Z3A38 38 weeks gestation of pregnancy: Secondary | ICD-10-CM

## 2018-07-16 LAB — POCT URINALYSIS DIPSTICK OB
BILIRUBIN UA: NEGATIVE
Blood, UA: NEGATIVE
Glucose, UA: NEGATIVE
Ketones, UA: NEGATIVE
Leukocytes, UA: NEGATIVE
NITRITE UA: NEGATIVE
PH UA: 6 (ref 5.0–8.0)
POC,PROTEIN,UA: NEGATIVE
Spec Grav, UA: 1.02 (ref 1.010–1.025)
UROBILINOGEN UA: 0.2 U/dL

## 2018-07-16 NOTE — Progress Notes (Signed)
ROB: Does note some back pain. Thinks she may have a UTI. Denies dysuria or hematuria.  UA neg. Advised on adequate hydration. Can use Tylenol, warm compresses/heating pads for back pain. Answered all questions regarding C-section again.  RTC in 1 week.  NST performed today was reviewed and was found to be reactive.  Continue recommended antenatal testing and prenatal care.   NONSTRESS TEST INTERPRETATION  INDICATIONS: Obesity (morbid)  FHR baseline: 145 bpm RESULTS:Reactive COMMENTS: BPs inaccurate (wrong size cuff used). Correct cuff taken on Dynamap, 119/80.    PLAN: 1. Continue fetal kick counts twice a day. 2. Continue antepartum testing as scheduled-weekly

## 2018-07-16 NOTE — Progress Notes (Signed)
ROB-Pt present today for prenatal care. Pt stated that she was doing well other than that she is having some back pain that she think it maybe an UTI. No other complaints.

## 2018-07-23 ENCOUNTER — Ambulatory Visit (INDEPENDENT_AMBULATORY_CARE_PROVIDER_SITE_OTHER): Payer: BC Managed Care – PPO | Admitting: Obstetrics and Gynecology

## 2018-07-23 ENCOUNTER — Encounter: Payer: Self-pay | Admitting: Obstetrics and Gynecology

## 2018-07-23 ENCOUNTER — Other Ambulatory Visit: Payer: BC Managed Care – PPO

## 2018-07-23 ENCOUNTER — Ambulatory Visit: Payer: BC Managed Care – PPO | Admitting: Obstetrics and Gynecology

## 2018-07-23 VITALS — BP 123/84 | HR 94 | Ht 65.0 in | Wt 311.0 lb

## 2018-07-23 VITALS — BP 123/84 | HR 94 | Wt 311.0 lb

## 2018-07-23 DIAGNOSIS — O09219 Supervision of pregnancy with history of pre-term labor, unspecified trimester: Secondary | ICD-10-CM | POA: Diagnosis not present

## 2018-07-23 DIAGNOSIS — Z3A39 39 weeks gestation of pregnancy: Secondary | ICD-10-CM

## 2018-07-23 DIAGNOSIS — Z3483 Encounter for supervision of other normal pregnancy, third trimester: Secondary | ICD-10-CM

## 2018-07-23 DIAGNOSIS — O09293 Supervision of pregnancy with other poor reproductive or obstetric history, third trimester: Secondary | ICD-10-CM | POA: Diagnosis not present

## 2018-07-23 DIAGNOSIS — Z98891 History of uterine scar from previous surgery: Secondary | ICD-10-CM

## 2018-07-23 DIAGNOSIS — O99213 Obesity complicating pregnancy, third trimester: Secondary | ICD-10-CM

## 2018-07-23 LAB — POCT URINALYSIS DIPSTICK OB
BILIRUBIN UA: NEGATIVE
Glucose, UA: NEGATIVE
Ketones, UA: NEGATIVE
LEUKOCYTES UA: NEGATIVE
Nitrite, UA: NEGATIVE
PH UA: 6 (ref 5.0–8.0)
POC,PROTEIN,UA: NEGATIVE
RBC UA: NEGATIVE
SPEC GRAV UA: 1.015 (ref 1.010–1.025)
UROBILINOGEN UA: 0.2 U/dL

## 2018-07-23 NOTE — Progress Notes (Signed)
ROB: Patient without complaint.  She is prepared for her cesarean delivery in 1 week. NONSTRESS TEST INTERPRETATION  INDICATIONS: Obesity and history of preeclampsia RESULTS:  reactive COMMENTS:   PLAN: 1. Continue fetal kick counts as directed. 2. Continue antepartum testing as scheduled.  Elonda Huskyavid J. Evans, M.D. 07/23/2018 10:53 AM

## 2018-07-23 NOTE — Progress Notes (Signed)
Pt presents today for ROB and NST. Other than back pain, the pt is doing well.

## 2018-07-29 ENCOUNTER — Other Ambulatory Visit: Payer: Self-pay

## 2018-07-29 ENCOUNTER — Inpatient Hospital Stay
Admission: EM | Admit: 2018-07-29 | Discharge: 2018-07-29 | DRG: 787 | Disposition: A | Discharge status: Home / Self Care | Payer: BC Managed Care – PPO | Attending: Obstetrics and Gynecology | Admitting: Obstetrics and Gynecology

## 2018-07-29 DIAGNOSIS — O9081 Anemia of the puerperium: Secondary | ICD-10-CM | POA: Diagnosis not present

## 2018-07-29 DIAGNOSIS — O99214 Obesity complicating childbirth: Secondary | ICD-10-CM | POA: Diagnosis not present

## 2018-07-29 DIAGNOSIS — O471 False labor at or after 37 completed weeks of gestation: Secondary | ICD-10-CM

## 2018-07-29 DIAGNOSIS — O34211 Maternal care for low transverse scar from previous cesarean delivery: Secondary | ICD-10-CM | POA: Diagnosis present

## 2018-07-29 DIAGNOSIS — Z3A39 39 weeks gestation of pregnancy: Secondary | ICD-10-CM

## 2018-07-29 DIAGNOSIS — O99824 Streptococcus B carrier state complicating childbirth: Secondary | ICD-10-CM | POA: Diagnosis not present

## 2018-07-29 NOTE — OB Triage Note (Signed)
Ms. Abran RichardVertiz here with c/o contractions since 0600, reports loss of mucous plug, denies bleeding, LOF.  Reports positive fetal movement, rates discomfort with ctx as 7/10, occurring every 5-6 minutes.

## 2018-07-29 NOTE — Discharge Summary (Signed)
    L&D OB Triage Note  SUBJECTIVE Deanna Newman is a 32 y.o. 73P1011 female at 1310w2d, EDD Estimated Date of Delivery: 08/03/18 who presented to triage with complaints of contractions.   OB History  Gravida Para Term Preterm AB Living  3 1 1  0 1 1  SAB TAB Ectopic Multiple Live Births  1 0 0 0 1    # Outcome Date GA Lbr Len/2nd Weight Sex Delivery Anes PTL Lv  3 Current           2 SAB 11/11/16 8122w0d         1 Term 05/23/12 9373w0d  3742 g M CS-LTranv   LIV    Medications Prior to Admission  Medication Sig Dispense Refill Last Dose  . cholecalciferol (VITAMIN D) 1000 units tablet Take 1,000 Units by mouth daily.   Past Week at Unknown time  . docusate sodium (COLACE) 100 MG capsule Take 100 mg by mouth daily.   Past Week at Unknown time  . ferrous sulfate 325 (65 FE) MG tablet Take 650 mg by mouth daily with breakfast.    Past Week at Unknown time  . Prenatal Vit-Fe Fumarate-FA (PRENATAL MULTIVITAMIN) TABS tablet Take 1 tablet by mouth daily.    Past Week at Unknown time  . albuterol (PROVENTIL HFA;VENTOLIN HFA) 108 (90 Base) MCG/ACT inhaler Inhale 2 puffs into the lungs every 6 (six) hours as needed for wheezing or shortness of breath. (Patient not taking: Reported on 07/29/2018) 1 Inhaler 2 Not Taking at Unknown time  . aspirin EC 81 MG tablet Take 81 mg by mouth daily.   Not Taking at Unknown time     OBJECTIVE  Nursing Evaluation:   BP 127/89 (BP Location: Right Arm)   Pulse (!) 115   Temp 98.6 F (37 C) (Oral)   Resp 18   Ht 5\' 5"  (1.651 m)   Wt (!) 141.1 kg   LMP 10/27/2017 (Exact Date)   BMI 51.75 kg/m    Findings:   Unfavorable cervix - not in labor.  NST was performed and has been reviewed by me.  NST INTERPRETATION: Category I  Mode: External Baseline Rate (A): 130 bpm Variability: Moderate Accelerations: 15 x 15 Decelerations: None     Contraction Frequency (min): 7-9  ASSESSMENT Impression:  1.  Pregnancy:  G3P1011 at 4610w2d , EDD Estimated Date of  Delivery: 08/03/18 2.  NST:  Category I  PLAN 1. Reassurance given 2. Discharge home with standard labor precautions given to return to L&D or call the office for problems. 3. Continue routine prenatal care. 4.  CD as scheduled Wed.

## 2018-07-29 NOTE — Discharge Instructions (Signed)
Call with bleeding, leaking fluid or increase in pain

## 2018-07-30 ENCOUNTER — Other Ambulatory Visit: Payer: Self-pay

## 2018-07-30 ENCOUNTER — Telehealth: Payer: Self-pay

## 2018-07-30 ENCOUNTER — Observation Stay
Admission: EM | Admit: 2018-07-30 | Discharge: 2018-07-30 | Disposition: A | Discharge status: Home / Self Care | Payer: BC Managed Care – PPO | Attending: Obstetrics and Gynecology | Admitting: Obstetrics and Gynecology

## 2018-07-30 ENCOUNTER — Encounter
Admission: RE | Admit: 2018-07-30 | Discharge: 2018-07-30 | Disposition: A | Discharge status: Home / Self Care | Payer: BC Managed Care – PPO | Source: Ambulatory Visit | Attending: Obstetrics and Gynecology | Admitting: Obstetrics and Gynecology

## 2018-07-30 ENCOUNTER — Encounter: Payer: Self-pay | Admitting: Obstetrics and Gynecology

## 2018-07-30 DIAGNOSIS — Z79899 Other long term (current) drug therapy: Secondary | ICD-10-CM | POA: Insufficient documentation

## 2018-07-30 DIAGNOSIS — Z01812 Encounter for preprocedural laboratory examination: Secondary | ICD-10-CM | POA: Insufficient documentation

## 2018-07-30 DIAGNOSIS — O471 False labor at or after 37 completed weeks of gestation: Principal | ICD-10-CM | POA: Insufficient documentation

## 2018-07-30 DIAGNOSIS — Z3A39 39 weeks gestation of pregnancy: Secondary | ICD-10-CM | POA: Insufficient documentation

## 2018-07-30 DIAGNOSIS — Z7982 Long term (current) use of aspirin: Secondary | ICD-10-CM | POA: Insufficient documentation

## 2018-07-30 LAB — URINALYSIS, ROUTINE W REFLEX MICROSCOPIC
Bilirubin Urine: NEGATIVE
GLUCOSE, UA: NEGATIVE mg/dL
Ketones, ur: NEGATIVE mg/dL
LEUKOCYTES UA: NEGATIVE
Nitrite: NEGATIVE
Protein, ur: NEGATIVE mg/dL
Specific Gravity, Urine: 1.017 (ref 1.005–1.030)
pH: 6 (ref 5.0–8.0)

## 2018-07-30 LAB — TYPE AND SCREEN
ABO/RH(D): O POS
Antibody Screen: NEGATIVE
EXTEND SAMPLE REASON: UNDETERMINED

## 2018-07-30 LAB — CBC
HCT: 35.6 % — ABNORMAL LOW (ref 36.0–46.0)
Hemoglobin: 11.2 g/dL — ABNORMAL LOW (ref 12.0–15.0)
MCH: 24.7 pg — AB (ref 26.0–34.0)
MCHC: 31.5 g/dL (ref 30.0–36.0)
MCV: 78.6 fL — ABNORMAL LOW (ref 80.0–100.0)
Platelets: 324 10*3/uL (ref 150–400)
RBC: 4.53 MIL/uL (ref 3.87–5.11)
RDW: 15.4 % (ref 11.5–15.5)
WBC: 12.1 10*3/uL — ABNORMAL HIGH (ref 4.0–10.5)
nRBC: 0 % (ref 0.0–0.2)

## 2018-07-30 MED ORDER — ONDANSETRON HCL 4 MG/2ML IJ SOLN
4.0000 mg | Freq: Once | INTRAMUSCULAR | Status: AC
  Administered 2018-07-30: 4 mg via INTRAVENOUS
  Filled 2018-07-30: qty 2

## 2018-07-30 MED ORDER — BUTORPHANOL TARTRATE 2 MG/ML IJ SOLN
1.0000 mg | Freq: Once | INTRAMUSCULAR | Status: AC
  Administered 2018-07-30: 1 mg via INTRAVENOUS

## 2018-07-30 MED ORDER — LACTATED RINGERS IV SOLN
INTRAVENOUS | Status: DC
  Administered 2018-07-30 (×2): via INTRAVENOUS

## 2018-07-30 MED ORDER — BUTORPHANOL TARTRATE 2 MG/ML IJ SOLN
INTRAMUSCULAR | Status: AC
  Administered 2018-07-30: 1 mg via INTRAVENOUS
  Filled 2018-07-30: qty 1

## 2018-07-30 MED ORDER — LACTATED RINGERS IV BOLUS
500.0000 mL | Freq: Once | INTRAVENOUS | Status: AC
  Administered 2018-07-30: 500 mL via INTRAVENOUS

## 2018-07-30 MED ORDER — OXYCODONE-ACETAMINOPHEN 5-325 MG PO TABS: 2.0000 | ORAL_TABLET | Freq: Once | ORAL | Status: DC

## 2018-07-30 MED ORDER — LACTATED RINGERS IV SOLN
Freq: Once | INTRAVENOUS | Status: AC
  Administered 2018-07-30: 03:00:00 via INTRAVENOUS

## 2018-07-30 NOTE — Discharge Summary (Addendum)
    L&D OB Triage Note  SUBJECTIVE Deanna Newman is a 32 y.o. 893P1011 female at 7152w3d, EDD Estimated Date of Delivery: 08/03/18 who presented to triage with complaints of contractions.  Denies bleeding or ROM.   OB History  Gravida Para Term Preterm AB Living  3 1 1  0 1 1  SAB TAB Ectopic Multiple Live Births  1 0 0 0 1    # Outcome Date GA Lbr Len/2nd Weight Sex Delivery Anes PTL Lv  3 Current           2 SAB 11/11/16 4555w0d         1 Term 05/23/12 483w0d  3742 g M CS-LTranv   LIV    Medications Prior to Admission  Medication Sig Dispense Refill Last Dose  . cholecalciferol (VITAMIN D) 1000 units tablet Take 1,000 Units by mouth daily.   07/29/2018 at Unknown time  . docusate sodium (COLACE) 100 MG capsule Take 100 mg by mouth daily.   07/29/2018 at Unknown time  . ferrous sulfate 325 (65 FE) MG tablet Take 650 mg by mouth daily with breakfast.    07/29/2018 at Unknown time  . Prenatal Vit-Fe Fumarate-FA (PRENATAL MULTIVITAMIN) TABS tablet Take 1 tablet by mouth daily.    07/29/2018 at Unknown time  . albuterol (PROVENTIL HFA;VENTOLIN HFA) 108 (90 Base) MCG/ACT inhaler Inhale 2 puffs into the lungs every 6 (six) hours as needed for wheezing or shortness of breath. (Patient not taking: Reported on 07/29/2018) 1 Inhaler 2 Not Taking at Unknown time  . aspirin EC 81 MG tablet Take 81 mg by mouth daily.   Completed Course at Unknown time     OBJECTIVE  Nursing Evaluation:   BP 125/80   Pulse 74   Temp 98.1 F (36.7 C) (Oral)   Resp 18   Ht 5\' 5"  (1.651 m)   Wt (!) 141.1 kg   LMP 10/27/2017 (Exact Date)   BMI 51.76 kg/m    Findings:   Irregular contractions without cervical change. Pt personally seen by me.  NST was performed and has been reviewed by me. Possible sinusoidal pattern noted after Stadol.  Spontaneous resolution.  Strip then reactive. Cat 1  NST INTERPRETATION: Category I  Mode: External Baseline Rate (A): 120 bpm Variability: Moderate Accelerations: 15 x  15 Decelerations: None     Contraction Frequency (min): occasional  ASSESSMENT/PLAN Impression:  1.  Pregnancy:  G3P1011 at 6252w3d , EDD Estimated Date of Delivery: 08/03/18 2.  NST:  Category I  3.  Kick counts 4.  Scheduled for CD tomorrow. 5.  Home rest today - discussed S&S of labor.

## 2018-07-30 NOTE — Telephone Encounter (Signed)
Pt was called to see how she was doing after she called yesterday stating that she was having contractions and was concerned due to she is scheduled for a C-section on Wednesday. Pt stated that she went to L&D and they stated that she was dehydrated and she was given fluids. Pt stated that she was still having contractions but she was okay.

## 2018-07-30 NOTE — OB Triage Note (Signed)
Pt presents to L&D with c/o contractions every 5 mins increasing in intensity. Pt denies vaginal bleeding, leaking of fluid, or decreased fetal movement. Toco and EFM applied and explained. Will monitor closely.

## 2018-07-31 ENCOUNTER — Inpatient Hospital Stay
Admission: RE | Admit: 2018-07-31 | Payer: BC Managed Care – PPO | Source: Ambulatory Visit | Admitting: Obstetrics and Gynecology

## 2018-07-31 ENCOUNTER — Encounter
Admission: RE | Disposition: A | Discharge status: Home / Self Care | Payer: Self-pay | Source: Ambulatory Visit | Attending: Obstetrics and Gynecology

## 2018-07-31 ENCOUNTER — Inpatient Hospital Stay: Payer: BC Managed Care – PPO | Admitting: Anesthesiology

## 2018-07-31 ENCOUNTER — Other Ambulatory Visit: Payer: Self-pay

## 2018-07-31 ENCOUNTER — Inpatient Hospital Stay (HOSPITAL_COMMUNITY)
Admission: RE | Admit: 2018-07-31 | Discharge: 2018-08-02 | Disposition: A | Discharge status: Home / Self Care | Payer: BC Managed Care – PPO | Source: Ambulatory Visit | Attending: Obstetrics and Gynecology | Admitting: Obstetrics and Gynecology

## 2018-07-31 DIAGNOSIS — Z9889 Other specified postprocedural states: Secondary | ICD-10-CM

## 2018-07-31 DIAGNOSIS — O3480 Maternal care for other abnormalities of pelvic organs, unspecified trimester: Secondary | ICD-10-CM

## 2018-07-31 DIAGNOSIS — Z3A39 39 weeks gestation of pregnancy: Secondary | ICD-10-CM

## 2018-07-31 DIAGNOSIS — D508 Other iron deficiency anemias: Secondary | ICD-10-CM

## 2018-07-31 DIAGNOSIS — N736 Female pelvic peritoneal adhesions (postinfective): Secondary | ICD-10-CM

## 2018-07-31 DIAGNOSIS — O99214 Obesity complicating childbirth: Secondary | ICD-10-CM

## 2018-07-31 DIAGNOSIS — O34211 Maternal care for low transverse scar from previous cesarean delivery: Principal | ICD-10-CM

## 2018-07-31 DIAGNOSIS — O99824 Streptococcus B carrier state complicating childbirth: Secondary | ICD-10-CM

## 2018-07-31 DIAGNOSIS — Z98891 History of uterine scar from previous surgery: Secondary | ICD-10-CM

## 2018-07-31 LAB — RPR: RPR Ser Ql: NONREACTIVE

## 2018-07-31 SURGERY — Surgical Case
Anesthesia: Spinal | Site: Abdomen

## 2018-07-31 MED ORDER — ENOXAPARIN SODIUM 40 MG/0.4ML ~~LOC~~ SOLN
40.0000 mg | SUBCUTANEOUS | Status: DC
  Administered 2018-08-01 – 2018-08-02 (×2): 40 mg via SUBCUTANEOUS
  Filled 2018-07-31 (×2): qty 0.4

## 2018-07-31 MED ORDER — OXYCODONE HCL 5 MG PO TABS
5.0000 mg | ORAL_TABLET | ORAL | Status: DC | PRN
  Administered 2018-08-01 – 2018-08-02 (×2): 5 mg via ORAL
  Filled 2018-07-31 (×2): qty 1

## 2018-07-31 MED ORDER — FENTANYL CITRATE (PF) 100 MCG/2ML IJ SOLN
INTRAMUSCULAR | Status: AC
  Filled 2018-07-31: qty 2

## 2018-07-31 MED ORDER — PROMETHAZINE HCL 25 MG/ML IJ SOLN
INTRAMUSCULAR | Status: AC
  Filled 2018-07-31: qty 1

## 2018-07-31 MED ORDER — CARBOPROST TROMETHAMINE 250 MCG/ML IM SOLN
INTRAMUSCULAR | Status: AC
  Filled 2018-07-31: qty 1

## 2018-07-31 MED ORDER — BUPIVACAINE IN DEXTROSE 0.75-8.25 % IT SOLN
INTRATHECAL | Status: DC | PRN
  Administered 2018-07-31: 1.6 mL via INTRATHECAL

## 2018-07-31 MED ORDER — LACTATED RINGERS IV SOLN
INTRAVENOUS | Status: DC
  Administered 2018-08-01: 10:00:00 via INTRAVENOUS

## 2018-07-31 MED ORDER — OXYTOCIN 40 UNITS IN LACTATED RINGERS INFUSION - SIMPLE MED
INTRAVENOUS | Status: AC
  Filled 2018-07-31: qty 1000

## 2018-07-31 MED ORDER — FERROUS SULFATE 325 (65 FE) MG PO TABS
325.0000 mg | ORAL_TABLET | Freq: Two times a day (BID) | ORAL | Status: DC
  Administered 2018-08-01 – 2018-08-02 (×3): 325 mg via ORAL
  Filled 2018-07-31 (×3): qty 1

## 2018-07-31 MED ORDER — SENNOSIDES-DOCUSATE SODIUM 8.6-50 MG PO TABS
2.0000 | ORAL_TABLET | ORAL | Status: DC
  Administered 2018-08-01 – 2018-08-02 (×2): 2 via ORAL
  Filled 2018-07-31 (×3): qty 2

## 2018-07-31 MED ORDER — AMMONIA AROMATIC IN INHA
RESPIRATORY_TRACT | Status: AC
  Filled 2018-07-31: qty 10

## 2018-07-31 MED ORDER — PHENYLEPHRINE HCL 10 MG/ML IJ SOLN
INTRAMUSCULAR | Status: DC | PRN
  Administered 2018-07-31 (×3): 100 ug via INTRAVENOUS

## 2018-07-31 MED ORDER — SIMETHICONE 80 MG PO CHEW
80.0000 mg | CHEWABLE_TABLET | ORAL | Status: DC
  Administered 2018-08-01 – 2018-08-02 (×2): 80 mg via ORAL
  Filled 2018-07-31 (×3): qty 1

## 2018-07-31 MED ORDER — LACTATED RINGERS IV BOLUS
1000.0000 mL | Freq: Once | INTRAVENOUS | Status: AC
  Administered 2018-07-31: 1000 mL via INTRAVENOUS

## 2018-07-31 MED ORDER — NALOXONE HCL 0.4 MG/ML IJ SOLN: 0.4000 mg | INTRAMUSCULAR | Status: DC | PRN

## 2018-07-31 MED ORDER — OXYCODONE HCL 5 MG PO TABS: 10.0000 mg | ORAL_TABLET | ORAL | Status: DC | PRN

## 2018-07-31 MED ORDER — MENTHOL 3 MG MT LOZG
1.0000 | LOZENGE | OROMUCOSAL | Status: DC | PRN
  Filled 2018-07-31: qty 9

## 2018-07-31 MED ORDER — ONDANSETRON HCL 4 MG/2ML IJ SOLN
INTRAMUSCULAR | Status: DC | PRN
  Administered 2018-07-31: 4 mg via INTRAVENOUS

## 2018-07-31 MED ORDER — DEXTROSE 5 % IV SOLN
3.0000 g | Freq: Once | INTRAVENOUS | Status: AC
  Administered 2018-07-31: 3 g via INTRAVENOUS
  Filled 2018-07-31: qty 3

## 2018-07-31 MED ORDER — DIBUCAINE 1 % RE OINT: 1.0000 "application " | TOPICAL_OINTMENT | RECTAL | Status: DC | PRN

## 2018-07-31 MED ORDER — MISOPROSTOL 200 MCG PO TABS
ORAL_TABLET | ORAL | Status: AC
  Filled 2018-07-31: qty 4

## 2018-07-31 MED ORDER — LIDOCAINE 5 % EX PTCH
MEDICATED_PATCH | CUTANEOUS | Status: DC | PRN
  Administered 2018-07-31: 1 via TRANSDERMAL

## 2018-07-31 MED ORDER — NITROFURANTOIN MONOHYD MACRO 100 MG PO CAPS
100.0000 mg | ORAL_CAPSULE | Freq: Every day | ORAL | Status: DC
  Administered 2018-07-31 – 2018-08-01 (×2): 100 mg via ORAL
  Filled 2018-07-31 (×3): qty 1

## 2018-07-31 MED ORDER — KETOROLAC TROMETHAMINE 30 MG/ML IJ SOLN
30.0000 mg | Freq: Four times a day (QID) | INTRAMUSCULAR | Status: AC
  Administered 2018-08-01 (×3): 30 mg via INTRAVENOUS
  Filled 2018-07-31 (×3): qty 1

## 2018-07-31 MED ORDER — ACETAMINOPHEN 325 MG PO TABS
650.0000 mg | ORAL_TABLET | Freq: Four times a day (QID) | ORAL | Status: DC
  Administered 2018-07-31: 650 mg via ORAL
  Filled 2018-07-31: qty 2

## 2018-07-31 MED ORDER — NALBUPHINE HCL 10 MG/ML IJ SOLN: 5.0000 mg | INTRAMUSCULAR | Status: DC | PRN

## 2018-07-31 MED ORDER — KETOROLAC TROMETHAMINE 30 MG/ML IJ SOLN
30.0000 mg | Freq: Four times a day (QID) | INTRAMUSCULAR | Status: DC
  Administered 2018-07-31: 30 mg via INTRAVENOUS
  Filled 2018-07-31: qty 1

## 2018-07-31 MED ORDER — DIPHENHYDRAMINE HCL 50 MG/ML IJ SOLN
12.5000 mg | INTRAMUSCULAR | Status: DC | PRN
  Administered 2018-07-31: 12.5 mg via INTRAVENOUS
  Filled 2018-07-31: qty 1

## 2018-07-31 MED ORDER — WITCH HAZEL-GLYCERIN EX PADS: 1.0000 "application " | MEDICATED_PAD | CUTANEOUS | Status: DC | PRN

## 2018-07-31 MED ORDER — SIMETHICONE 80 MG PO CHEW: 80.0000 mg | CHEWABLE_TABLET | ORAL | Status: DC | PRN

## 2018-07-31 MED ORDER — METHYLERGONOVINE MALEATE 0.2 MG/ML IJ SOLN
INTRAMUSCULAR | Status: AC
  Filled 2018-07-31: qty 1

## 2018-07-31 MED ORDER — KETOROLAC TROMETHAMINE 30 MG/ML IJ SOLN: 30.0000 mg | Freq: Four times a day (QID) | INTRAMUSCULAR | Status: AC

## 2018-07-31 MED ORDER — SOD CITRATE-CITRIC ACID 500-334 MG/5ML PO SOLN
30.0000 mL | ORAL | Status: AC
  Administered 2018-07-31: 30 mL via ORAL
  Filled 2018-07-31: qty 30

## 2018-07-31 MED ORDER — LIDOCAINE 5 % EX PTCH
1.0000 | MEDICATED_PATCH | CUTANEOUS | Status: DC
  Administered 2018-07-31 – 2018-08-01 (×2): 1 via TRANSDERMAL
  Filled 2018-07-31: qty 1

## 2018-07-31 MED ORDER — SODIUM CHLORIDE 0.9 % IV SOLN
INTRAVENOUS | Status: DC | PRN
  Administered 2018-07-31: 30 ug/min via INTRAVENOUS

## 2018-07-31 MED ORDER — KETOROLAC TROMETHAMINE 30 MG/ML IJ SOLN: 30.0000 mg | Freq: Four times a day (QID) | INTRAMUSCULAR | Status: DC

## 2018-07-31 MED ORDER — ONDANSETRON HCL 4 MG/2ML IJ SOLN: 4.0000 mg | Freq: Three times a day (TID) | INTRAMUSCULAR | Status: DC | PRN

## 2018-07-31 MED ORDER — SODIUM CHLORIDE 0.9% FLUSH: 3.0000 mL | INTRAVENOUS | Status: DC | PRN

## 2018-07-31 MED ORDER — DIPHENHYDRAMINE HCL 25 MG PO CAPS
25.0000 mg | ORAL_CAPSULE | ORAL | Status: DC | PRN
  Administered 2018-08-01: 25 mg via ORAL
  Filled 2018-07-31: qty 1

## 2018-07-31 MED ORDER — ACETAMINOPHEN 325 MG PO TABS
650.0000 mg | ORAL_TABLET | Freq: Four times a day (QID) | ORAL | Status: AC
  Administered 2018-08-01 (×2): 650 mg via ORAL
  Filled 2018-07-31 (×2): qty 2

## 2018-07-31 MED ORDER — OXYTOCIN 40 UNITS IN LACTATED RINGERS INFUSION - SIMPLE MED
INTRAVENOUS | Status: DC | PRN
  Administered 2018-07-31: 1000 mL via INTRAVENOUS

## 2018-07-31 MED ORDER — NALBUPHINE HCL 10 MG/ML IJ SOLN: 5.0000 mg | Freq: Once | INTRAMUSCULAR | Status: DC | PRN

## 2018-07-31 MED ORDER — MISOPROSTOL 200 MCG PO TABS
ORAL_TABLET | ORAL | Status: AC
  Filled 2018-07-31: qty 1

## 2018-07-31 MED ORDER — FENTANYL CITRATE (PF) 100 MCG/2ML IJ SOLN
INTRAMUSCULAR | Status: DC | PRN
  Administered 2018-07-31: 15 ug via INTRAVENOUS

## 2018-07-31 MED ORDER — OXYTOCIN 40 UNITS IN LACTATED RINGERS INFUSION - SIMPLE MED
2.5000 [IU]/h | INTRAVENOUS | Status: AC
  Filled 2018-07-31: qty 1000

## 2018-07-31 MED ORDER — PRENATAL MULTIVITAMIN CH
1.0000 | ORAL_TABLET | Freq: Every day | ORAL | Status: DC
  Administered 2018-08-01 – 2018-08-02 (×2): 1 via ORAL
  Filled 2018-07-31 (×2): qty 1

## 2018-07-31 MED ORDER — MEASLES, MUMPS & RUBELLA VAC IJ SOLR
0.5000 mL | Freq: Once | INTRAMUSCULAR | Status: DC
  Filled 2018-07-31: qty 0.5

## 2018-07-31 MED ORDER — MORPHINE SULFATE (PF) 0.5 MG/ML IJ SOLN
INTRAMUSCULAR | Status: DC | PRN
  Administered 2018-07-31: .1 mg via EPIDURAL

## 2018-07-31 MED ORDER — DEXTROSE 5 % IV SOLN
3.0000 g | INTRAVENOUS | Status: DC
  Filled 2018-07-31: qty 3000

## 2018-07-31 MED ORDER — MORPHINE SULFATE (PF) 0.5 MG/ML IJ SOLN
INTRAMUSCULAR | Status: AC
  Filled 2018-07-31: qty 10

## 2018-07-31 MED ORDER — DIPHENHYDRAMINE HCL 25 MG PO CAPS: 25.0000 mg | ORAL_CAPSULE | Freq: Four times a day (QID) | ORAL | Status: DC | PRN

## 2018-07-31 MED ORDER — PROMETHAZINE HCL 25 MG/ML IJ SOLN
6.2500 mg | INTRAMUSCULAR | Status: DC | PRN
  Administered 2018-07-31: 6.25 mg via INTRAVENOUS

## 2018-07-31 MED ORDER — LIDOCAINE 5 % EX PTCH
1.0000 | MEDICATED_PATCH | CUTANEOUS | Status: DC
  Filled 2018-07-31: qty 1

## 2018-07-31 MED ORDER — COCONUT OIL OIL
1.0000 "application " | TOPICAL_OIL | Status: DC | PRN
  Administered 2018-08-02: 1 via TOPICAL
  Filled 2018-07-31: qty 120

## 2018-07-31 MED ORDER — MAGNESIUM HYDROXIDE 400 MG/5ML PO SUSP: 30.0000 mL | ORAL | Status: DC | PRN

## 2018-07-31 MED ORDER — ZOLPIDEM TARTRATE 5 MG PO TABS: 5.0000 mg | ORAL_TABLET | Freq: Every evening | ORAL | Status: DC | PRN

## 2018-07-31 MED ORDER — LACTATED RINGERS IV SOLN
INTRAVENOUS | Status: DC
  Administered 2018-07-31: 12:00:00 via INTRAVENOUS

## 2018-07-31 SURGICAL SUPPLY — 32 items
BAG COUNTER SPONGE EZ (MISCELLANEOUS) ×2 IMPLANT
CANISTER SUCT 3000ML PPV (MISCELLANEOUS) ×3 IMPLANT
CHLORAPREP W/TINT 26ML (MISCELLANEOUS) ×6 IMPLANT
COUNTER SPONGE BAG EZ (MISCELLANEOUS) ×1
COVER WAND RF STERILE (DRAPES) ×3 IMPLANT
DRSG TELFA 3X8 NADH (GAUZE/BANDAGES/DRESSINGS) ×3 IMPLANT
ELECT CAUTERY BLADE 6.4 (BLADE) ×3 IMPLANT
ELECT REM PT RETURN 9FT ADLT (ELECTROSURGICAL) ×3
ELECTRODE REM PT RTRN 9FT ADLT (ELECTROSURGICAL) ×1 IMPLANT
GAUZE SPONGE 4X4 12PLY STRL (GAUZE/BANDAGES/DRESSINGS) ×3 IMPLANT
GLOVE BIO SURGEON STRL SZ 6.5 (GLOVE) ×2 IMPLANT
GLOVE BIO SURGEONS STRL SZ 6.5 (GLOVE) ×1
GLOVE INDICATOR 7.0 STRL GRN (GLOVE) ×3 IMPLANT
GLOVE SURG SYN 7.5  E (GLOVE) ×2
GLOVE SURG SYN 7.5 E (GLOVE) ×1 IMPLANT
GLOVE SURG SYN 8.0 (GLOVE) ×6 IMPLANT
GOWN STRL REUS W/ TWL LRG LVL3 (GOWN DISPOSABLE) ×2 IMPLANT
GOWN STRL REUS W/TWL LRG LVL3 (GOWN DISPOSABLE) ×4
KIT TURNOVER KIT A (KITS) ×3 IMPLANT
NS IRRIG 1000ML POUR BTL (IV SOLUTION) ×3 IMPLANT
PACK C SECTION AR (MISCELLANEOUS) ×3 IMPLANT
PAD OB MATERNITY 4.3X12.25 (PERSONAL CARE ITEMS) ×3 IMPLANT
PAD PREP 24X41 OB/GYN DISP (PERSONAL CARE ITEMS) ×3 IMPLANT
RETRACTOR TRAXI PANNICULUS (MISCELLANEOUS) ×1 IMPLANT
SUT MNCRL AB 4-0 PS2 18 (SUTURE) ×3 IMPLANT
SUT PLAIN 2 0 XLH (SUTURE) IMPLANT
SUT VIC AB 0 CT1 36 (SUTURE) ×12 IMPLANT
SUT VIC AB 2-0 SH 27 (SUTURE) ×4
SUT VIC AB 2-0 SH 27XBRD (SUTURE) ×2 IMPLANT
SUT VIC AB 3-0 SH 27 (SUTURE) ×2
SUT VIC AB 3-0 SH 27X BRD (SUTURE) ×1 IMPLANT
TRAXI PANNICULUS RETRACTOR (MISCELLANEOUS) ×2

## 2018-07-31 NOTE — Anesthesia Procedure Notes (Signed)
Date/Time: 07/31/2018 2:40 PM Performed by: Karoline CaldwellStarr, Neeta Storey, CRNA Pre-anesthesia Checklist: Patient identified, Emergency Drugs available, Suction available, Patient being monitored and Timeout performed Oxygen Delivery Method: Nasal cannula Placement Confirmation: positive ETCO2

## 2018-07-31 NOTE — Op Note (Signed)
Cesarean Section Procedure Note  Indications: previous uterine incision low-transverse x 1, declines vaginal attempt  Pre-operative Diagnosis: 39 week 4 day pregnancy, morbid obesity (BMI 51), prior C-section x 1.  Post-operative Diagnosis: same with dense pelvic adhesions, with cystotomy  Surgeon: Deanna Laser, MD  Assistants: Sharon Seller, MD.  No other capable assistant available in surgery requiring high level assistant.  Procedure: Repeat low transverse Cesarean Section with cystotomy repair  Anesthesia: Spinal anesthesia  Findings: Female infant, cephalic presentation, 3780 grams, with Apgar scores of 9 at one minute and 9 at five minutes. Intact placenta with 3 vessel cord.  Dense abdominal and pelvic adhesions to the fundus and lower uterine segment.   Procedure Details: The patient was seen in the Holding Room. The risks, benefits, complications, treatment options, and expected outcomes were discussed with the patient.  The patient concurred with the proposed plan, giving informed consent.  The site of surgery properly noted/marked. The patient was taken to the Operating Room, identified as Deanna Newman and the procedure verified as repeat C-Section Delivery. A Time Out was held and the above information confirmed.  After induction of anesthesia, the patient was draped and prepped in the usual sterile manner. Anesthesia was tested and noted to be adequate. A Pfannenstiel incision was made and carried down through the subcutaneous tissue to the fascia. Fascial incision was made and extended transversely. The fascia was separated from the underlying rectus tissue superiorly and inferiorly. The peritoneum was identified and entered. Peritoneal incision was extended longitudinally. The surgical assist was able to provide retraction to allow for clear visualization of surgical site. The peritoneum was noted to be thickened and adherent to the lower uterine segment. During posterior  extension of the peritoneum, an incidental cystotomy occurred, approximately 1 cm in size. The utero-vesical peritoneal reflection was incised transversely and the bladder flap was bluntly freed from the lower uterine segment. A low transverse uterine incision was made. Delivered from cephalic presentation was a 3780 gram Female with Apgar scores of 9 at one minute and 9 at five minutes.  The assistant was able to apply adequate fundal pressure to allow for successful delivery of the fetus. After the umbilical cord was clamped and cut cord blood was obtained for evaluation. The placenta was removed intact and appeared normal. The uterus was unable to be exteriorized due to fundal and lower uterine segment adhesions.  Unable to visualize fallopian tubes or ovaries. The uterine incision was closed with running locked sutures of 0-Vicryl.   Hemostasis was observed. Lavage was carried out until clear.  The bladder was then grasped at either end of the cystotomy site.  The bladder mucosa was repaired in a running fashion using 3-0 Vicryl.  The bladder serosa was then repaired in a running over-laying fashion above the previous repair with 2-0 Vicryl. The fascia was then reapproximated with a running suture of 0-Vicryl. The subcutaneous fat layer was reapproximated with 2-0 Vicryl. The skin was reapproximated with 4-0 Monocryl.  Steri-strips were placed over then incision.   Instrument, sponge, and needle counts were correct prior the abdominal closure and at the conclusion of the case.   Estimated Blood Loss:  500 ml      Drains: foley catheter to gravity drainage, 50 ml clear urine at end of the procedure         Total IV Fluids:  1000 ml  Specimens: Cord blood obtained.          Implants: None  Complications:  None; patient tolerated the procedure well.         Disposition: PACU - hemodynamically stable.         Condition: stable   Deanna Newman, Deanna Samples, MD [facility]

## 2018-07-31 NOTE — Anesthesia Post-op Follow-up Note (Signed)
Anesthesia QCDR form completed.        

## 2018-07-31 NOTE — Transfer of Care (Signed)
Immediate Anesthesia Transfer of Care Note  Patient: Deanna PlumCarrie N Windish  Procedure(s) Performed: REPEAT CESAREAN SECTION (N/A Abdomen)  Patient Location: PACU  Anesthesia Type:Spinal  Level of Consciousness: awake, alert  and oriented  Airway & Oxygen Therapy: Patient Spontanous Breathing  Post-op Assessment: Report given to RN and Post -op Vital signs reviewed and stable  Post vital signs: Reviewed and stable  Last Vitals:  Vitals Value Taken Time  BP 122/70 07/31/2018  4:09 PM  Temp 36.3 C 07/31/2018  4:09 PM  Pulse 66 07/31/2018  4:09 PM  Resp 18 07/31/2018  4:09 PM  SpO2      Last Pain:  Vitals:   07/31/18 1609  TempSrc:   PainSc: 0-No pain      Patients Stated Pain Goal: 0 (07/31/18 1320)  Complications: No apparent anesthesia complications

## 2018-07-31 NOTE — Anesthesia Preprocedure Evaluation (Signed)
Anesthesia Evaluation  Patient identified by MRN, date of birth, ID band Patient awake    Reviewed: Allergy & Precautions, NPO status , Patient's Chart, lab work & pertinent test results  History of Anesthesia Complications Negative for: history of anesthetic complications  Airway Mallampati: III  TM Distance: >3 FB Neck ROM: Full    Dental no notable dental hx.    Pulmonary asthma (mild intermittent) ,    breath sounds clear to auscultation- rhonchi (-) wheezing      Cardiovascular hypertension, (-) CAD, (-) Past MI, (-) Cardiac Stents and (-) CABG  Rhythm:Regular Rate:Normal - Systolic murmurs and - Diastolic murmurs    Neuro/Psych  Headaches, negative psych ROS   GI/Hepatic Neg liver ROS, GERD  ,  Endo/Other  negative endocrine ROSneg diabetes  Renal/GU negative Renal ROS     Musculoskeletal negative musculoskeletal ROS (+)   Abdominal (+) + obese,   Peds  Hematology  (+) anemia ,   Anesthesia Other Findings Past Medical History: No date: Anemia No date: Asthma No date: Cataract No date: Gestational hypertension No date: Migraine 2010: Vaginal Pap smear, abnormal     Comment:  followed by colposcopy   Reproductive/Obstetrics (+) Pregnancy                             Lab Results  Component Value Date   WBC 12.1 (H) 07/30/2018   HGB 11.2 (L) 07/30/2018   HCT 35.6 (L) 07/30/2018   MCV 78.6 (L) 07/30/2018   PLT 324 07/30/2018    Anesthesia Physical Anesthesia Plan  ASA: II  Anesthesia Plan: Spinal   Post-op Pain Management:    Induction:   PONV Risk Score and Plan: 2 and Ondansetron  Airway Management Planned: Natural Airway  Additional Equipment:   Intra-op Plan:   Post-operative Plan:   Informed Consent: I have reviewed the patients History and Physical, chart, labs and discussed the procedure including the risks, benefits and alternatives for the proposed  anesthesia with the patient or authorized representative who has indicated his/her understanding and acceptance.   Dental advisory given  Plan Discussed with: CRNA and Anesthesiologist  Anesthesia Plan Comments:         Anesthesia Quick Evaluation

## 2018-07-31 NOTE — H&P (Signed)
Obstetric Preoperative History and Physical  Deanna PlumCarrie N Newman is a 32 y.o. G3P1011 with IUP at 1868w4d presenting for presenting for scheduled repeat cesarean section.  No acute concerns.   Prenatal Course Source of Care: [facility] with onset of care at 9 weeks Pregnancy complications or risks: Patient Active Problem List   Diagnosis Date Noted  . Indication for care in labor or delivery 07/30/2018  . Labor and delivery, indication for care 07/29/2018  . Gastroesophageal reflux disease without esophagitis 07/09/2018  . History of cesarean delivery 01/16/2018  . History of pre-eclampsia in prior pregnancy, currently pregnant 01/16/2018  . History of infertility 01/15/2018  . Morbid obesity (HCC) 10/21/2015  . Irregular menses 10/21/2015  . Mild intermittent asthma 03/16/2014  . Anemia, iron deficiency 11/03/2013   She plans to breastfeed She desires undecided  method for postpartum contraception.   Prenatal labs and studies: ABO, Rh: --/--/O POS (12/04 1103) Antibody: NEG (12/04 1103) Rubella: 2.93 (05/06 1517) RPR: Non Reactive (12/03 1218)  HBsAg: Negative (05/06 1517)  HIV: Non Reactive (05/06 1517)  ZOX:WRUEAVWUGBS:Positive (11/06 1127) 1 hr Glucola  normal Genetic screening declined Anatomy US normal   Past Medical History:  Diagnosis Date  . Anemia   . Asthma   . Cataract   . Gestational hypertension   . Migraine   . Vaginal Pap smear, abnormal 2010   followed by colposcopy    Past Surgical History:  Procedure Laterality Date  . CESAREAN SECTION    . WISDOM TOOTH EXTRACTION      OB History  Gravida Para Term Preterm AB Living  3 1 1   1 1   SAB TAB Ectopic Multiple Live Births  1       1    # Outcome Date GA Lbr Len/2nd Weight Sex Delivery Anes PTL Lv  3 Current           2 SAB 11/11/16 6142w0d         1 Term 05/23/12 4125w0d  3742 g M CS-LTranv   LIV    Social History   Socioeconomic History  . Marital status: Divorced    Spouse name: Not on file  .  Number of children: 1  . Years of education: Not on file  . Highest education level: Not on file  Occupational History  . Occupation: Database administratorAdmin Specialist  Social Needs  . Financial resource strain: Not hard at all  . Food insecurity:    Worry: Never true    Inability: Never true  . Transportation needs:    Medical: No    Non-medical: No  Tobacco Use  . Smoking status: Never Smoker  . Smokeless tobacco: Never Used  Substance and Sexual Activity  . Alcohol use: Not Currently    Comment: Occasionally   . Drug use: No  . Sexual activity: Yes    Birth control/protection: None  Lifestyle  . Physical activity:    Days per week: 0 days    Minutes per session: 0 min  . Stress: To some extent  Relationships  . Social connections:    Talks on phone: More than three times a week    Gets together: More than three times a week    Attends religious service: 1 to 4 times per year    Active member of club or organization: No    Attends meetings of clubs or organizations: Never    Relationship status: Living with partner  Other Topics Concern  . Not on file  Social History Narrative  . Not on file    Family History  Problem Relation Age of Onset  . Skin cancer Mother   . GER disease Mother   . Diabetes Father   . COPD Father        smoker  . Asthma Father   . Hypertension Father   . Heart failure Father   . Hypertension Sister   . COPD Paternal Grandmother   . Heart disease Paternal Grandmother   . Diabetes Paternal Grandmother   . Lung cancer Paternal Grandmother        smoker  . Skin cancer Maternal Grandmother     Medications Prior to Admission  Medication Sig Dispense Refill Last Dose  . aspirin EC 81 MG tablet Take 81 mg by mouth daily.   Past Month at Unknown time  . cholecalciferol (VITAMIN D) 1000 units tablet Take 1,000 Units by mouth daily.   07/30/2018 at Unknown time  . docusate sodium (COLACE) 100 MG capsule Take 100 mg by mouth daily.   Past Month at Unknown  time  . ferrous sulfate 325 (65 FE) MG tablet Take 650 mg by mouth daily with breakfast.    07/30/2018 at Unknown time  . Prenatal Vit-Fe Fumarate-FA (PRENATAL MULTIVITAMIN) TABS tablet Take 1 tablet by mouth daily.    07/30/2018 at Unknown time  . albuterol (PROVENTIL HFA;VENTOLIN HFA) 108 (90 Base) MCG/ACT inhaler Inhale 2 puffs into the lungs every 6 (six) hours as needed for wheezing or shortness of breath. (Patient not taking: Reported on 07/29/2018) 1 Inhaler 2 Not Taking at Unknown time    Allergies  Allergen Reactions  . Fentanyl Itching    Makes pt want to scratch skin off   . Sumatriptan Anaphylaxis    Review of Systems: Negative except for what is mentioned in HPI.  Physical Exam: BP (!) 129/92 (BP Location: Left Arm)   Pulse (!) 103   Temp 98.1 F (36.7 C) (Oral)   Resp 16   Ht 5\' 5"  (1.651 m)   Wt (!) 142.4 kg   LMP 10/27/2017 (Exact Date)   BMI 52.25 kg/m  FHR by Doppler: 145 bpm GENERAL: Well-developed, well-nourished female in no acute distress.  LUNGS: Clear to auscultation bilaterally.  HEART: Regular rate and rhythm. ABDOMEN: Soft, nontender, nondistended, gravid, well-healed Pfannenstiel incision. PELVIC: Deferred EXTREMITIES: Nontender, no edema, 2+ distal pulses.   Pertinent Labs/Studies:   Results for orders placed or performed during the hospital encounter of 07/31/18 (from the past 72 hour(s))  Type and screen [facility]     Status: None   Collection Time: 07/31/18 11:03 AM  Result Value Ref Range   ABO/RH(D) O POS    Antibody Screen NEG    Sample Expiration      08/03/2018 Performed at [facility], [address] [place], [place] [address]     Assessment and Plan :[name] is a 32 y.o. G3P1011 at [redacted]w[redacted]d being admitted  for scheduled repeat cesarean section delivery . The patient is understanding of the planned procedure and is aware of and accepting of all surgical risks, including but not limited to:  bleeding which may require transfusion or reoperation; infection which may require antibiotics; injury to bowel, bladder, ureters or other surrounding organs which may require repair; injury to the fetus; need for additional procedures including hysterectomy in the event of life-threatening complications; placental abnormalities wth subsequent pregnancies; incisional problems; blood clot disorders which may require blood thinners;, and other  postoperative/anesthesia complications. The patient is in agreement with the proposed plan, and gives informed written consent for the procedure. All questions have been answered.   Hildred Laser, MD [facility]

## 2018-07-31 NOTE — Anesthesia Procedure Notes (Signed)
Spinal  Patient location during procedure: OR Staffing Performed: anesthesiologist  Preanesthetic Checklist Completed: patient identified, site marked, surgical consent, pre-op evaluation, timeout performed, IV checked, risks and benefits discussed and monitors and equipment checked Spinal Block Patient position: sitting Prep: ChloraPrep Patient monitoring: heart rate, cardiac monitor, continuous pulse ox and blood pressure Approach: right paramedian Location: L3-4 Injection technique: single-shot Needle Needle type: Pencil-Tip  Needle gauge: 22 G Needle length: 10 cm Additional Notes Attempted w 27g and 25g pencil tip, unable to locate CSF. Switch to 22g pencil, clear CSF. No paraesthesia, easy aspirate. Tol well.

## 2018-07-31 NOTE — Consult Note (Signed)
Neonatology Note:   Attendance at C-section:    I was asked by Dr. Valentino Saxonherry to attend this repeat C/S at term. The mother is a G3P1A1 O pos, GBS pos with morbid obesity. ROM at delivery, fluid clear. Infant vigorous with good spontaneous cry and tone. Delayed cord clamping was done. Needed no suctioning. Ap 9/9. Lungs clear to ausc in DR. Infant is able to remain with his mother for skin to skin time under nursing supervision. Transferred to the care of Pediatrician.   Doretha Souhristie C. Gweneth Fredlund, MD

## 2018-08-01 ENCOUNTER — Encounter: Payer: Self-pay | Admitting: Obstetrics and Gynecology

## 2018-08-01 LAB — CBC
HCT: 31.6 % — ABNORMAL LOW (ref 36.0–46.0)
Hemoglobin: 9.8 g/dL — ABNORMAL LOW (ref 12.0–15.0)
MCH: 24.4 pg — ABNORMAL LOW (ref 26.0–34.0)
MCHC: 31 g/dL (ref 30.0–36.0)
MCV: 78.6 fL — ABNORMAL LOW (ref 80.0–100.0)
Platelets: 301 10*3/uL (ref 150–400)
RBC: 4.02 MIL/uL (ref 3.87–5.11)
RDW: 15.6 % — ABNORMAL HIGH (ref 11.5–15.5)
WBC: 10.6 10*3/uL — AB (ref 4.0–10.5)
nRBC: 0 % (ref 0.0–0.2)

## 2018-08-01 LAB — ABO/RH: ABO/RH(D): O POS

## 2018-08-01 LAB — CREATININE, SERUM
Creatinine, Ser: 0.69 mg/dL (ref 0.44–1.00)
GFR calc Af Amer: >60 mL/min (ref >60)

## 2018-08-01 MED ORDER — IBUPROFEN 600 MG PO TABS
600.0000 mg | ORAL_TABLET | Freq: Four times a day (QID) | ORAL | Status: DC
  Administered 2018-08-01 – 2018-08-02 (×4): 600 mg via ORAL
  Filled 2018-08-01 (×5): qty 1

## 2018-08-01 MED ORDER — ACETAMINOPHEN 325 MG PO TABS
650.0000 mg | ORAL_TABLET | ORAL | Status: DC | PRN
  Administered 2018-08-01 – 2018-08-02 (×4): 650 mg via ORAL
  Filled 2018-08-01 (×4): qty 2

## 2018-08-01 MED ORDER — PHENAZOPYRIDINE HCL 100 MG PO TABS
100.0000 mg | ORAL_TABLET | Freq: Three times a day (TID) | ORAL | Status: DC | PRN
  Administered 2018-08-01: 100 mg via ORAL
  Filled 2018-08-01 (×2): qty 1

## 2018-08-01 NOTE — Progress Notes (Signed)
Postpartum Day # 1: Cesarean Delivery (repeat) with cystotomy repair  Subjective: Patient reports tolerating PO and + flatus.  Has not ambulated much yet. Foley catheter in place, draining. Patient noted that earlier this morning she felt like she was gushing fluid/possibly urine.   Objective: Vital signs in last 24 hours: Temp:  [97.4 F (36.3 C)-98.6 F (37 C)] 98.1 F (36.7 C) (12/05 0816) Pulse Rate:  [63-122] 103 (12/05 0816) Resp:  [12-27] 18 (12/05 0816) BP: (113-136)/(56-101) 135/74 (12/05 0816) SpO2:  [97 %-100 %] 100 % (12/05 0900)   I/O last 3 completed shifts: In: 2466.7 [P.O.:1000; I.V.:1466.7] Out: 2050 [Urine:600; Other:700; Blood:750] No intake/output data recorded.    Physical Exam:  General: alert and no distress Lungs: clear to auscultation bilaterally Breasts: normal appearance, no masses or tenderness Heart: regular rate and rhythm, S1, S2 normal, no murmur, click, rub or gallop Abdomen: soft, non-tender; bowel sounds normal; no masses,  no organomegaly Pelvis: Lochia appropriate, Uterine Fundus firm, Incision: healing well, no significant drainage, no dehiscence, no significant erythema.  Foley catheter in place, draining to gravity, with ~ 60 cc of urine.  Extremities: DVT Evaluation: No evidence of DVT seen on physical exam. Negative Homan's sign. No cords or calf tenderness. No significant calf/ankle edema.  Recent Labs    07/30/18 1218 08/01/18 0459  HGB 11.2* 9.8*  HCT 35.6* 31.6*    Assessment/Plan: Status post Cesarean section. Doing well postoperatively. Discussed intraoperative findings again with patient and family members.  Regular diet as tolerated Continue PO pain management Breastfeeding Maintain foley catheter for 10 days.  Will change to leg bag closer to discharge day.  Discontinue IVF.  Will continue to monitor for any further possible leakage of urine.  Can also administer a dose of Urobel to help identify for any urinary leaks.   Continue current care. Unsure of contraceptive desires. Will discuss further at postpartum visit.  Anemia mild postpartum. Will treat with PO iron supplementation.  Plan for discharge in 1-2 days.     Hildred LaserAnika Shaka Cardin [facility]

## 2018-08-01 NOTE — Lactation Note (Signed)
This note was copied from a baby's chart. Lactation Consultation Note  Patient Name: Deanna Clenton PareCarrie Ducey [id number]'s Date: 08/01/2018 Reason for consult: Follow-up assessment   Maternal Data  Motehr has been goven our contact information and education on engorgement.  Feeding Feeding Type: Unknown  LATCH Score Latch: Repeated attempts needed to sustain latch, nipple held in mouth throughout feeding, stimulation needed to elicit sucking reflex.  Audible Swallowing: A few with stimulation  Type of Nipple: Everted at rest and after stimulation  Comfort (Breast/Nipple): Soft / non-tender  Hold (Positioning): Assistance needed to correctly position infant at breast and maintain latch.  LATCH Score: 7  Interventions Interventions: Assisted with latch;Hand express;Breast feeding basics reviewed;Adjust position;Support pillows  Lactation Tools Discussed/Used     Consult Status Consult Status: Follow-up Date: 08/01/18 Follow-up type: In-patient    Deanna Newman 08/01/2018, 1:18 PM

## 2018-08-01 NOTE — Anesthesia Post-op Follow-up Note (Signed)
  Anesthesia Pain Follow-up Note  Patient: Deanna Newman  Day #: 1  Date of Follow-up: 08/01/2018 Time: 8:10 AM  Last Vitals:  Vitals:   08/01/18 0500 08/01/18 0700  BP:    Pulse:    Resp:    Temp:    SpO2: 98% 100%    Level of Consciousness: alert  Pain: none   Side Effects:Pruritis  Catheter Site Exam:clean, dry, no drainage  Anti-Coag Meds (From admission, onward)   Start     Dose/Rate Route Frequency Ordered Stop   08/01/18 0800  enoxaparin (LOVENOX) injection 40 mg     40 mg Subcutaneous Every 24 hours 07/31/18 2014         Plan: D/C from anesthesia care at surgeon's request  Marijke Guadiana Lawerance CruelStarr

## 2018-08-01 NOTE — Anesthesia Postprocedure Evaluation (Signed)
Anesthesia Post Note  Patient: Deanna Newman  Procedure(s) Performed: REPEAT CESAREAN SECTION (N/A Abdomen)  Patient location during evaluation: Mother Baby Anesthesia Type: Spinal Level of consciousness: oriented and awake and alert Pain management: pain level controlled Vital Signs Assessment: post-procedure vital signs reviewed and stable Respiratory status: spontaneous breathing and respiratory function stable Cardiovascular status: blood pressure returned to baseline and stable Postop Assessment: no headache, no backache, no apparent nausea or vomiting and able to ambulate Anesthetic complications: no     Last Vitals:  Vitals:   08/01/18 0500 08/01/18 0700  BP:    Pulse:    Resp:    Temp:    SpO2: 98% 100%    Last Pain:  Vitals:   08/01/18 0315  TempSrc:   PainSc: 0-No pain                 Samay Delcarlo Lawerance CruelStarr

## 2018-08-01 NOTE — Lactation Note (Signed)
This note was copied from a baby's chart. Lactation Consultation Note  Patient Name: Boy Clenton PareCarrie Mannis [id number]'s Date: 08/01/2018 Reason for consult: Initial assessment   Maternal Data    Feeding Feeding Type: Breast Fed  LATCH Score Latch: Repeated attempts needed to sustain latch, nipple held in mouth throughout feeding, stimulation needed to elicit sucking reflex.  Audible Swallowing: A few with stimulation  Type of Nipple: Everted at rest and after stimulation  Comfort (Breast/Nipple): Soft / non-tender  Hold (Positioning): Assistance needed to correctly position infant at breast and maintain latch.  LATCH Score: 7  Interventions Interventions: Assisted with latch;Hand express;Breast feeding basics reviewed;Adjust position;Support pillows  Lactation Tools Discussed/Used     Consult Status Consult Status: Follow-up Date: 08/01/18 Follow-up type: In-patient LC to room to assist with latch and positioning. Infant was placed in cradle hold at the left breast. Infant successfully latched with no pain or discomfort for mother and audible swallows heard. Hand expression demonstrated with mother.   Arlyss Gandylicia Cataleah Stites 08/01/2018, 10:56 AM

## 2018-08-02 LAB — HIV-1/2 AB - DIFFERENTIATION
HIV 1 Ab: NEGATIVE
HIV 2 Ab: NEGATIVE
Note: NEGATIVE

## 2018-08-02 LAB — RNA QUALITATIVE: HIV 1 RNA Qualitative: 1

## 2018-08-02 MED ORDER — IBUPROFEN 800 MG PO TABS: 800.0000 mg | ORAL_TABLET | Freq: Three times a day (TID) | ORAL | 1 refills | Status: DC | PRN

## 2018-08-02 MED ORDER — NITROFURANTOIN MONOHYD MACRO 100 MG PO CAPS: 100.0000 mg | ORAL_CAPSULE | Freq: Every day | ORAL | 0 refills | Status: DC

## 2018-08-02 MED ORDER — OXYCODONE-ACETAMINOPHEN 5-325 MG PO TABS: 1.0000 | ORAL_TABLET | Freq: Four times a day (QID) | ORAL | 0 refills | Status: DC | PRN

## 2018-08-02 MED ORDER — DOCUSATE SODIUM 100 MG PO CAPS: 100.0000 mg | ORAL_CAPSULE | Freq: Two times a day (BID) | ORAL | 2 refills | Status: DC | PRN

## 2018-08-02 NOTE — Progress Notes (Signed)
Postpartum Day # 2: Cesarean Delivery (repeat) with cystotomy repair  Subjective: Patient reports tolerating PO, + flatus and + BM.  Pain is well controlled. Has ambulated. Foley catheter in place, draining. Patient noted that she had leaking again around her catheter last night.  The catheter was flushed and has no further issues so far.   Objective: Vitals:   08/01/18 1500 08/01/18 1526 08/01/18 2300 08/02/18 0825  BP:  116/72 121/78 121/70  Pulse:  90 88 83  Resp:  18 18 20   Temp:  98.1 F (36.7 C) 98 F (36.7 C) 97.8 F (36.6 C)  TempSrc:  Oral Oral Oral  SpO2: 98% 97% 97% 98%  Weight:      Height:        I/O last 3 completed shifts: In: 1938.8 [P.O.:1240; I.V.:698.8] Out: 2890 [Urine:2190; Other:700] No intake/output data recorded.    Physical Exam:  General: alert and no distress Lungs: clear to auscultation bilaterally Breasts: normal appearance, no masses or tenderness Heart: regular rate and rhythm, S1, S2 normal, no murmur, click, rub or gallop Abdomen: soft, non-tender; bowel sounds normal; no masses,  no organomegaly Pelvis: Lochia appropriate, Uterine Fundus firm, Incision: healing well, no significant drainage, no dehiscence, no significant erythema.  Foley catheter in place, draining to gravity, with light amber-colored urine. Extremities: DVT Evaluation: No evidence of DVT seen on physical exam. Negative Homan's sign. No cords or calf tenderness. No significant calf/ankle edema.  Recent Labs    07/30/18 1218 08/01/18 0459  HGB 11.2* 9.8*  HCT 35.6* 31.6*    Assessment/Plan: Status post Cesarean section. Doing well postoperatively.  Regular diet  Continue PO pain management Breastfeeding Maintain foley catheter for 10 days.  Will change to leg bag today. Will continue to monitor for any further possible leakage of urine.  Continue current care. Unsure of contraceptive desires. Will discuss further at postpartum visit.  Anemia mild postpartum. Will  treat with PO iron supplementation.  Plan for discharge either today or in a.m.     Deanna LaserAnika Misk Galentine, MD [facility]

## 2018-08-02 NOTE — Progress Notes (Signed)
Foley catheter care of overnight & leg bag explained by teach back method to patient and patient's mother at bedside. Written instructions provided, educated on infection prevention, and supplies given necessary for care at home.   Dr. Valentino Saxonherry notified patient would like to be discharged today.  Will make follow up appointment for 10 days post-op per Dr. Valentino Saxonherry.

## 2018-08-02 NOTE — Progress Notes (Signed)
Patient discharged home with infant. Discharge instructions, follow up appointment, and prescriptions given and reviewed with patient. Patient verbalized understanding. Will be escorted out by auxillary.

## 2018-08-02 NOTE — Lactation Note (Signed)
This note was copied from a baby's chart. nonLactation Consultation Note  Patient Name: Boy Clenton PareCarrie Horan JYNWG'NToday's Date: 08/02/2018 Reason for consult: Follow-up assessment   Maternal Data Formula Feeding for Exclusion: No Does the patient have breastfeeding experience prior to this delivery?: Yes  Feeding Feeding Type: Breast Fed  LATCH Score Latch: Grasps breast easily, tongue down, lips flanged, rhythmical sucking.  Audible Swallowing: Spontaneous and intermittent  Type of Nipple: Everted at rest and after stimulation  Comfort (Breast/Nipple): Soft / non-tender  Hold (Positioning): No assistance needed to correctly position infant at breast.  LATCH Score: 10  Interventions   none Lactation Tools Discussed/Used     Consult Status Consult Status: PRN    Dyann KiefMarsha D Jairon Ripberger 08/02/2018, 2:29 PM

## 2018-08-02 NOTE — Discharge Summary (Signed)
Obstetric Discharge Summary Reason for Admission: cesarean section (repeat) Prenatal Procedures: NST and ultrasound Intrapartum Procedures: cesarean: low cervical, transverse and GBS prophylaxis Postpartum Procedures: antibiotics (prophylaxis for retained urinary catheter) Complications-Operative and Postpartum: cystotomy with bladder repair Hemoglobin  Date Value Ref Range Status  08/01/2018 9.8 (L) 12.0 - 15.0 g/dL Final  16/10/960410/08/2017 54.011.6 11.1 - 15.9 g/dL Final   HCT  Date Value Ref Range Status  08/01/2018 31.6 (L) 36.0 - 46.0 % Final   Hematocrit  Date Value Ref Range Status  05/28/2018 35.6 34.0 - 46.6 % Final    Physical Exam:  General: alert and no distress Lochia: appropriate Uterine Fundus: firm Incision: healing well, no significant drainage, no dehiscence, no significant erythema DVT Evaluation: No evidence of DVT seen on physical exam. Negative Homan's sign. No cords or calf tenderness. No significant calf/ankle edema.  Discharge Diagnoses: Term Pregnancy-delivered and cystotomy with bladder repair, morbid obesity, pelvic adhesions  Discharge Information: Date: 08/02/2018 Activity: pelvic rest Diet: routine Medications: PNV, Ibuprofen, Colace, Iron and Percocet, and Macrobid Condition: stable Instructions: refer to practice specific booklet Discharge to: home   Follow-up Information    Deanna Newman, Deanna Hinch, Deanna Newman Follow up in 8 day(s).   Specialties:  Obstetrics and Gynecology, Radiology Why:  incision check and remove foley. Contact information: [address] [place] [address] [phone]           Newborn Data: Live born female  Birth Weight: 8 lb 5.3 oz (3780 g) APGAR: 9, 9  Newborn Delivery   Birth date/time:  07/31/2018 15:08:00 Delivery type:  C-Section, Low Transverse Trial of labor:  No C-section categorization:  Repeat     Home with mother.  Deanna Newman 08/02/2018, 1:54 PM

## 2018-08-03 DIAGNOSIS — O3480 Maternal care for other abnormalities of pelvic organs, unspecified trimester: Secondary | ICD-10-CM

## 2018-08-03 DIAGNOSIS — N736 Female pelvic peritoneal adhesions (postinfective): Secondary | ICD-10-CM

## 2018-08-09 ENCOUNTER — Encounter: Payer: Self-pay | Admitting: Obstetrics and Gynecology

## 2018-08-09 ENCOUNTER — Ambulatory Visit (INDEPENDENT_AMBULATORY_CARE_PROVIDER_SITE_OTHER): Payer: BC Managed Care – PPO | Admitting: Obstetrics and Gynecology

## 2018-08-09 VITALS — BP 127/94 | HR 134 | Ht 65.0 in | Wt 291.4 lb

## 2018-08-09 DIAGNOSIS — R238 Other skin changes: Secondary | ICD-10-CM

## 2018-08-09 DIAGNOSIS — Z09 Encounter for follow-up examination after completed treatment for conditions other than malignant neoplasm: Secondary | ICD-10-CM

## 2018-08-09 DIAGNOSIS — Z98891 History of uterine scar from previous surgery: Secondary | ICD-10-CM

## 2018-08-09 DIAGNOSIS — Z9889 Other specified postprocedural states: Secondary | ICD-10-CM

## 2018-08-09 NOTE — Progress Notes (Signed)
    OBSTETRICS/GYNECOLOGY POST-OPERATIVE CLINIC VISIT  Subjective:     Deanna Newman is a 32 y.o. female who presents to the clinic 10 days status post repeat C-section with cystotomy repair for history of C-section x 1 and cystotomy. Eating a regular diet without difficulty. Bowel movements are normal. Pain is controlled with current analgesics. Medications being used: Ibuprofen.  The following portions of the patient's history were reviewed and updated as appropriate: allergies, current medications, past family history, past medical history, past social history, past surgical history and problem list.  Review of Systems A comprehensive review of systems was negative except for: Skin irritation near groin/vulvar area x 1 day.  Notes that it was itching badly, and may have scratched and irritated it.  Denies vaginal discharge    Objective:    BP (!) 127/94   Pulse (!) 134   Ht 5\' 5"  (1.651 m)   Wt 291 lb 6.4 oz (132.2 kg)   LMP 10/27/2017 (Exact Date)   Breastfeeding Yes   BMI 48.49 kg/m  General:  alert and no distress  Abdomen: soft, bowel sounds active, non-tender  Incision:   healing well, no drainage, no erythema, no hernia, no seroma, no swelling, no dehiscence, incision well approximated  Skin: Desquamated area in right groin region, mildly erythematous near labia majora bilaterally.      Assessment:    S/p Cesarean section with cystotomy repair  Skin irritation  Plan:   1. Continue any current medications.  Advised on use of hydrocortisone cream to raw skin areas in groin/vulva.  2. Wound care discussed.  Leg-bag foley catheter removed today.  3. Operative findings again reviewed.  4. Activity restrictions: no bending, stooping, or squatting, no lifting more than 10-15 pounds and pelvic rest x 5 weeks 5. Anticipated return to work: 4 weeks. 6. Follow up: 4-5 weeks for postpartum visit.      Hildred Laserherry, Geoff Dacanay, MD [facility]

## 2018-08-09 NOTE — Progress Notes (Signed)
Pt is present today for c-section incision checked. Pt stated that she was doing well no complaints.

## 2018-09-17 ENCOUNTER — Encounter: Payer: Self-pay | Admitting: Obstetrics and Gynecology

## 2018-09-17 ENCOUNTER — Ambulatory Visit (INDEPENDENT_AMBULATORY_CARE_PROVIDER_SITE_OTHER): Payer: BC Managed Care – PPO | Admitting: Obstetrics and Gynecology

## 2018-09-17 DIAGNOSIS — Z3009 Encounter for other general counseling and advice on contraception: Secondary | ICD-10-CM

## 2018-09-17 DIAGNOSIS — Z6841 Body Mass Index (BMI) 40.0 and over, adult: Secondary | ICD-10-CM

## 2018-09-17 DIAGNOSIS — Z1389 Encounter for screening for other disorder: Secondary | ICD-10-CM | POA: Diagnosis not present

## 2018-09-17 NOTE — Progress Notes (Signed)
   OBSTETRICS POSTPARTUM CLINIC PROGRESS NOTE  Subjective:     Deanna Newman is a 33 y.o. [phone] female who presents for a postpartum visit. She is 6 weeks postpartum following a repeat low cervical transverse Cesarean section. I have fully reviewed the prenatal and intrapartum course.  Intrapartum course was complicated by cystotomy during C-section, requiring indwelling cathter x 1 week. The delivery was at 39 gestational weeks.  Anesthesia: spinal. Postpartum course has been well. Baby's course has been well. Baby is feeding by breast. Bleeding: patient has/has not resumed menses, with Patient's last menstrual period was 10/27/2017 (exact date).. Bowel function is normal. Bladder function is normal. Patient is sexually active (initial encounter last Saturday, unprotected but used withdrawal method). Contraception method desired is undecided. Postpartum depression screening: negative.   The following portions of the patient's history were reviewed and updated as appropriate: allergies, current medications, past family history, past medical history, past social history, past surgical history and problem list.  Review of Systems Pertinent items noted in HPI and remainder of comprehensive ROS otherwise negative.   Objective:    BP 119/80   Pulse (!) 102   Ht 5\' 5"  (1.651 m)   Wt 288 lb 12.8 oz (131 kg)   LMP 10/27/2017 (Exact Date)   Breastfeeding Yes   BMI 48.06 kg/m   General:  alert and no distress   Breasts:  inspection negative, no nipple discharge or bleeding, no masses or nodularity palpable  Lungs: clear to auscultation bilaterally  Heart:  regular rate and rhythm, S1, S2 normal, no murmur, click, rub or gallop  Abdomen: soft, non-tender; bowel sounds normal; no masses,  no organomegaly.  Well healed Pfannenstiel incision   Vulva:  normal  Vagina: normal vagina, no discharge, exudate, lesion, or erythema  Cervix:  no cervical motion tenderness and no lesions  Corpus: normal  size, contour, position, consistency, mobility, non-tender  Adnexa:  normal adnexa and no mass, fullness, tenderness  Rectal Exam: Not performed.         Labs:  Lab Results  Component Value Date   HGB 9.8 (L) 08/01/2018     Assessment:   Postpartum care following cesarean delivery  Encounter for counseling regarding contraception  Morbid obesity with BMI of 45.0-49.9, adult (HCC)   Plan:    1. Contraception: undecided. Education given regarding options for contraception, including barrier methods, injectable contraception, IUD placement, oral contraceptives, Nexplanon. 2. Discussed lifestyles and weight management.  3. Follow up in: 6 months or as needed.    Hildred Laser, MD [facility]

## 2018-09-17 NOTE — Progress Notes (Signed)
PT is present today for her postpartum visit. Pt stated that she is breastfeeding and have had sexually intercourse recently without protection. Pt stated that she was going to use condoms and the pull out tech for birth control. EPDS= 1.  Pt stated that she is doing well no complaints.

## 2018-11-05 ENCOUNTER — Encounter: Payer: BC Managed Care – PPO | Admitting: Obstetrics and Gynecology

## 2019-03-18 ENCOUNTER — Other Ambulatory Visit: Payer: Self-pay

## 2019-03-18 ENCOUNTER — Encounter: Payer: Self-pay | Admitting: Obstetrics and Gynecology

## 2019-03-18 ENCOUNTER — Ambulatory Visit (INDEPENDENT_AMBULATORY_CARE_PROVIDER_SITE_OTHER): Payer: BC Managed Care – PPO | Admitting: Obstetrics and Gynecology

## 2019-03-18 VITALS — BP 122/82 | HR 80 | Ht 65.0 in | Wt 290.4 lb

## 2019-03-18 DIAGNOSIS — N898 Other specified noninflammatory disorders of vagina: Secondary | ICD-10-CM

## 2019-03-18 DIAGNOSIS — Z01419 Encounter for gynecological examination (general) (routine) without abnormal findings: Secondary | ICD-10-CM

## 2019-03-18 DIAGNOSIS — Z13220 Encounter for screening for lipoid disorders: Secondary | ICD-10-CM | POA: Diagnosis not present

## 2019-03-18 DIAGNOSIS — R399 Unspecified symptoms and signs involving the genitourinary system: Secondary | ICD-10-CM | POA: Diagnosis not present

## 2019-03-18 DIAGNOSIS — Z862 Personal history of diseases of the blood and blood-forming organs and certain disorders involving the immune mechanism: Secondary | ICD-10-CM

## 2019-03-18 DIAGNOSIS — N941 Unspecified dyspareunia: Secondary | ICD-10-CM

## 2019-03-18 DIAGNOSIS — Z3009 Encounter for other general counseling and advice on contraception: Secondary | ICD-10-CM

## 2019-03-18 DIAGNOSIS — Z131 Encounter for screening for diabetes mellitus: Secondary | ICD-10-CM

## 2019-03-18 LAB — POCT URINALYSIS DIPSTICK
Bilirubin, UA: NEGATIVE
Blood, UA: NEGATIVE
Glucose, UA: NEGATIVE
Ketones, UA: NEGATIVE
Leukocytes, UA: NEGATIVE
Nitrite, UA: NEGATIVE
Protein, UA: NEGATIVE
Spec Grav, UA: <=1.005 — AB (ref 1.010–1.025)
Urobilinogen, UA: 0.2 E.U./dL
pH, UA: 6 (ref 5.0–8.0)

## 2019-03-18 NOTE — Progress Notes (Signed)
Pt is present for annual exam. Pt stated having vaginal odor, painful sex since the birth of her baby, bleeding during sex/after once, possible UTI, and bump on inside of the vaginal area. Pt's lmp 1/13,2020 pt is still breastfeeding.

## 2019-03-18 NOTE — Progress Notes (Deleted)
GYNECOLOGY ANNUAL PHYSICAL EXAM PROGRESS NOTE  Subjective:    Deanna Newman is a 33 y.o. [id number] female who presents for annual exam. The patient has no complaints today. The patient is sexually active. The patient wears seatbelts: yes. The patient participates in regular exercise: no. Has the patient ever been transfused or tattooed?: no. The patient reports that there is not domestic violence in her life.   1.    Gynecologic History Menarche age: 43  Patient's last menstrual period was 09/09/2018.  Lactational amenorrhea   Contraception: none.  History of STI's: denies Last Pap: 2015. Results were: normal.  Notes h/o abnormal pap smear in 2010, followed with colposcopy.    OB History  Gravida Para Term Preterm AB Living  3 2 2  0 1 2  SAB TAB Ectopic Multiple Live Births  1 0 0 0 2    # Outcome Date GA Lbr Len/2nd Weight Sex Delivery Anes PTL Lv  3 Term 07/31/18 [redacted]w[redacted]d  8 lb 5.3 oz (3.78 kg) M CS-LTranv Spinal  LIV     Name: Timson,BOY Reannah     Apgar1: 9  Apgar5: 9  2 SAB 11/11/16 [redacted]w[redacted]d         1 Term 05/23/12 [redacted]w[redacted]d  8 lb 4 oz (3.742 kg) M CS-LTranv   LIV    Past Medical History:  Diagnosis Date  . Anemia   . Asthma   . Cataract   . Gestational hypertension   . Migraine   . Vaginal Pap smear, abnormal 2010   followed by colposcopy    Past Surgical History:  Procedure Laterality Date  . CESAREAN SECTION    . CESAREAN SECTION N/A 07/31/2018   Procedure: REPEAT CESAREAN SECTION;  Surgeon: Rubie Maid, MD;  Location: [facility] ORS;  Service: Obstetrics;  Laterality: N/A;  Time of Birth; 15:08 Sex: Female Wt: 8 LB 5 oz  . WISDOM TOOTH EXTRACTION      Family History  Problem Relation Age of Onset  . Skin cancer Mother   . GER disease Mother   . Diabetes Father   . COPD Father        smoker  . Asthma Father   . Hypertension Father   . Heart failure Father   . Hypertension Sister   . COPD Paternal Grandmother   . Heart disease Paternal Grandmother   .  Diabetes Paternal Grandmother   . Lung cancer Paternal Grandmother        smoker  . Skin cancer Maternal Grandmother     Social History   Socioeconomic History  . Marital status: Divorced    Spouse name: Not on file  . Number of children: 1  . Years of education: Not on file  . Highest education level: Not on file  Occupational History  . Occupation: Engineer, maintenance (IT)  Social Needs  . Financial resource strain: Not hard at all  . Food insecurity    Worry: Never true    Inability: Never true  . Transportation needs    Medical: No    Non-medical: No  Tobacco Use  . Smoking status: Never Smoker  . Smokeless tobacco: Never Used  Substance and Sexual Activity  . Alcohol use: Not Currently    Comment: Occasionally   . Drug use: No  . Sexual activity: Yes    Birth control/protection: None  Lifestyle  . Physical activity    Days per week: 0 days    Minutes per session: 0 min  . Stress:  To some extent  Relationships  . Social connections    Talks on phone: More than three times a week    Gets together: More than three times a week    Attends religious service: 1 to 4 times per year    Active member of club or organization: No    Attends meetings of clubs or organizations: Never    Relationship status: Living with partner  . Intimate partner violence    Fear of current or ex partner: Not on file    Emotionally abused: Not on file    Physically abused: Not on file    Forced sexual activity: Not on file  Other Topics Concern  . Not on file  Social History Narrative  . Not on file    Current Outpatient Medications on File Prior to Visit  Medication Sig Dispense Refill  . albuterol (PROVENTIL HFA;VENTOLIN HFA) 108 (90 Base) MCG/ACT inhaler Inhale 2 puffs into the lungs every 6 (six) hours as needed for wheezing or shortness of breath. 1 Inhaler 2  . Prenatal Vit-Fe Fumarate-FA (PRENATAL MULTIVITAMIN) TABS tablet Take 1 tablet by mouth daily.      No current  facility-administered medications on file prior to visit.     Allergies  Allergen Reactions  . Fentanyl Itching    Makes pt want to scratch skin off   . Sumatriptan Anaphylaxis     Review of Systems Constitutional: negative for chills, fatigue, fevers and sweats Eyes: negative for irritation, redness and visual disturbance Ears, nose, mouth, throat, and face: negative for hearing loss, nasal congestion, snoring and tinnitus Respiratory: negative for asthma, cough, sputum Cardiovascular: negative for chest pain, dyspnea, exertional chest pressure/discomfort, irregular heart beat, palpitations and syncope Gastrointestinal: negative for abdominal pain, change in bowel habits, nausea and vomiting.  Positive for rectal bleeding with bowel movements.   Does report some mild intermittent constipation. Genitourinary: positive for irregular menstrual periods, negative for genital lesions, sexual problems and vaginal discharge, dysuria and urinary incontinence Integument/breast: negative for breast lump, breast tenderness and nipple discharge Hematologic/lymphatic: negative for bleeding and easy bruising Musculoskeletal:negative for back pain and muscle weakness Neurological: negative for dizziness, headaches, vertigo and weakness Endocrine: negative for diabetic symptoms including polydipsia, polyuria and skin dryness Allergic/Immunologic: negative for hay fever and urticaria       Objective:  Blood pressure 122/82, pulse 80, height 5\' 5"  (1.651 m), weight 290 lb 6.4 oz (131.7 kg), last menstrual period 09/09/2018, currently breastfeeding. Body mass index is 48.33 kg/m.  General Appearance:    Alert, cooperative, no distress, appears stated age. obese  Head:    Normocephalic, without obvious abnormality, atraumatic  Eyes:    PERRL, conjunctiva/corneas clear, EOM's intact, both eyes  Ears:    Normal external ear canals, both ears  Nose:   Nares normal, septum midline, mucosa normal, no  drainage or sinus tenderness  Throat:   Lips, mucosa, and tongue normal; teeth and gums normal  Neck:   Supple, symmetrical, trachea midline, no adenopathy; thyroid: no enlargement/tenderness/nodules; no carotid bruit or JVD  Back:     Symmetric, no curvature, ROM normal, no CVA tenderness  Lungs:     Clear to auscultation bilaterally, respirations unlabored  Chest Wall:    No tenderness or deformity   Heart:    Regular rate and rhythm, S1 and S2 normal, no murmur, rub or gallop  Breast Exam:    No tenderness, masses, or nipple abnormality  Abdomen:     Soft, non-tender, bowel  sounds active all four quadrants, no masses, no organomegaly.    Genitalia:    Pelvic:external genitalia normal, vagina without lesions, discharge, or tenderness, rectovaginal septum  normal. Cervix normal in appearance, no cervical motion tenderness, no adnexal masses or tenderness.  Uterus normal size, shape, mobile, regular contours, nontender.  Rectal:    Normal external sphincter.  No hemorrhoids appreciated. Internal exam not done.   Extremities:   Extremities normal, atraumatic, no cyanosis or edema  Pulses:   2+ and symmetric all extremities  Skin:   Skin color, texture, turgor normal, no rashes or lesions  Lymph nodes:   Cervical, supraclavicular, and axillary nodes normal  Neurologic:   CNII-XII intact, normal strength, sensation and reflexes throughout     Labs:  Lab Results  Component Value Date   WBC 10.6 (H) 08/01/2018   HGB 9.8 (L) 08/01/2018   HCT 31.6 (L) 08/01/2018   MCV 78.6 (L) 08/01/2018   PLT 301 08/01/2018    Lab Results  Component Value Date   CREATININE 0.69 08/01/2018   BUN 12 10/30/2017   NA 140 10/30/2017   K 4.1 10/30/2017   CL 103 10/30/2017   CO2 22 10/30/2017    Lab Results  Component Value Date   ALT 16 10/30/2017   AST 18 10/30/2017   ALKPHOS 90 10/30/2017   BILITOT 0.2 10/30/2017    Lab Results  Component Value Date   TSH 1.800 12/31/2017    Assessment:     Healthy female exam.   Cervical cancer screening.  Obesity (Class III) Irregular menses Secondary infertility STD screening Rectal bleeding  Plan:     All questions answered. Pap smear performed today.  Blood tests: CBC with diff, Comprehensive metabolic panel and Glucose. Breast self exam technique reviewed and patient encouraged to perform self-exam monthly. Discussed healthy lifestyle modifications. Continue to encourage weight loss.  Desires STI testing. Contraception: none.  Follow up in 1 year for annual exam.    Hildred Laserherry, Shawntelle Ungar, MD [facility]
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
## 2019-03-19 LAB — COMPREHENSIVE METABOLIC PANEL
ALT: 17 IU/L (ref 0–32)
AST: 17 IU/L (ref 0–40)
Albumin/Globulin Ratio: 1.5 (ref 1.2–2.2)
Albumin: 4.4 g/dL (ref 3.8–4.8)
Alkaline Phosphatase: 118 IU/L — ABNORMAL HIGH (ref 39–117)
BUN/Creatinine Ratio: 16 (ref 9–23)
BUN: 11 mg/dL (ref 6–20)
Bilirubin Total: <0.2 mg/dL (ref 0.0–1.2)
CO2: 20 mmol/L (ref 20–29)
Calcium: 9.4 mg/dL (ref 8.7–10.2)
Chloride: 104 mmol/L (ref 96–106)
Creatinine, Ser: 0.67 mg/dL (ref 0.57–1.00)
GFR calc Af Amer: 135 mL/min/{1.73_m2} (ref >59)
GFR calc non Af Amer: 118 mL/min/{1.73_m2} (ref >59)
Globulin, Total: 3 g/dL (ref 1.5–4.5)
Glucose: 80 mg/dL (ref 65–99)
Potassium: 3.9 mmol/L (ref 3.5–5.2)
Sodium: 141 mmol/L (ref 134–144)
Total Protein: 7.4 g/dL (ref 6.0–8.5)

## 2019-03-19 LAB — CBC
Hematocrit: 35 % (ref 34.0–46.6)
Hemoglobin: 11 g/dL — ABNORMAL LOW (ref 11.1–15.9)
MCH: 22.7 pg — ABNORMAL LOW (ref 26.6–33.0)
MCHC: 31.4 g/dL — ABNORMAL LOW (ref 31.5–35.7)
MCV: 72 fL — ABNORMAL LOW (ref 79–97)
Platelets: 388 10*3/uL (ref 150–450)
RBC: 4.84 x10E6/uL (ref 3.77–5.28)
RDW: 14.9 % (ref 11.7–15.4)
WBC: 9.3 10*3/uL (ref 3.4–10.8)

## 2019-03-19 LAB — LIPID PANEL
Chol/HDL Ratio: 4.4 ratio (ref 0.0–4.4)
Cholesterol, Total: 157 mg/dL (ref 100–199)
HDL: 36 mg/dL — ABNORMAL LOW (ref >39)
LDL Calculated: 94 mg/dL (ref 0–99)
Triglycerides: 136 mg/dL (ref 0–149)
VLDL Cholesterol Cal: 27 mg/dL (ref 5–40)

## 2019-03-19 LAB — HEMOGLOBIN A1C
Est. average glucose Bld gHb Est-mCnc: 111 mg/dL
Hgb A1c MFr Bld: 5.5 % (ref 4.8–5.6)

## 2019-04-02 ENCOUNTER — Ambulatory Visit: Payer: BC Managed Care – PPO | Admitting: Obstetrics and Gynecology

## 2019-07-11 ENCOUNTER — Encounter: Payer: Self-pay | Admitting: Family Medicine

## 2019-07-11 ENCOUNTER — Other Ambulatory Visit: Payer: Self-pay

## 2019-07-11 ENCOUNTER — Ambulatory Visit (INDEPENDENT_AMBULATORY_CARE_PROVIDER_SITE_OTHER): Payer: BC Managed Care – PPO | Admitting: Family Medicine

## 2019-07-11 VITALS — BP 120/82 | HR 83 | Temp 97.3°F | Ht 65.0 in | Wt 280.0 lb

## 2019-07-11 DIAGNOSIS — Z0000 Encounter for general adult medical examination without abnormal findings: Secondary | ICD-10-CM

## 2019-07-11 DIAGNOSIS — M25562 Pain in left knee: Secondary | ICD-10-CM | POA: Insufficient documentation

## 2019-07-11 DIAGNOSIS — N912 Amenorrhea, unspecified: Secondary | ICD-10-CM | POA: Diagnosis not present

## 2019-07-11 DIAGNOSIS — F418 Other specified anxiety disorders: Secondary | ICD-10-CM | POA: Insufficient documentation

## 2019-07-11 DIAGNOSIS — J4520 Mild intermittent asthma, uncomplicated: Secondary | ICD-10-CM | POA: Diagnosis not present

## 2019-07-11 DIAGNOSIS — D508 Other iron deficiency anemias: Secondary | ICD-10-CM

## 2019-07-11 LAB — POCT URINE PREGNANCY: Preg Test, Ur: NEGATIVE

## 2019-07-11 NOTE — Assessment & Plan Note (Signed)
New problem, but ongoing for close to a year Knee exam is benign with no signs of meniscal tear or ligamental injury Discussed possibility of Baker's cyst We will obtain ultrasound to evaluate further Discussed ice, elevation, rest, compression Discussed importance of weight loss for joint pain

## 2019-07-11 NOTE — Assessment & Plan Note (Signed)
Discussed importance of healthy weight management Discussed diet and exercise  

## 2019-07-11 NOTE — Assessment & Plan Note (Signed)
Well controlled without any recent exacerbations Uncomplicated  Albuterol prn
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
## 2019-07-11 NOTE — Assessment & Plan Note (Signed)
Reviewed recent CBC Continue iron supplement

## 2019-07-11 NOTE — Patient Instructions (Addendum)
Preventive Care 31-31 Years Old, Female Preventive care refers to visits with your health care provider and lifestyle choices that can promote health and wellness. This includes:  A yearly physical exam. This may also be called an annual well check.  Regular dental visits and eye exams.  Immunizations.  Screening for certain conditions.  Healthy lifestyle choices, such as eating a healthy diet, getting regular exercise, not using drugs or products that contain nicotine and tobacco, and limiting alcohol use. What can I expect for my preventive care visit? Physical exam Your health care provider will check your:  Height and weight. This may be used to calculate body mass index (BMI), which tells if you are at a healthy weight.  Heart rate and blood pressure.  Skin for abnormal spots. Counseling Your health care provider may ask you questions about your:  Alcohol, tobacco, and drug use.  Emotional well-being.  Home and relationship well-being.  Sexual activity.  Eating habits.  Work and work Statistician.  Method of birth control.  Menstrual cycle.  Pregnancy history. What immunizations do I need?  Influenza (flu) vaccine  This is recommended every year. Tetanus, diphtheria, and pertussis (Tdap) vaccine  You may need a Td booster every 10 years. Varicella (chickenpox) vaccine  You may need this if you have not been vaccinated. Human papillomavirus (HPV) vaccine  If recommended by your health care provider, you may need three doses over 6 months. Measles, mumps, and rubella (MMR) vaccine  You may need at least one dose of MMR. You may also need a second dose. Meningococcal conjugate (MenACWY) vaccine  One dose is recommended if you are age 31-31 years and a first-year college student living in a residence hall, or if you have one of several medical conditions. You may also need additional booster doses. Pneumococcal conjugate (PCV13) vaccine  You may need  this if you have certain conditions and were not previously vaccinated. Pneumococcal polysaccharide (PPSV23) vaccine  You may need one or two doses if you smoke cigarettes or if you have certain conditions. Hepatitis A vaccine  You may need this if you have certain conditions or if you travel or work in places where you may be exposed to hepatitis A. Hepatitis B vaccine  You may need this if you have certain conditions or if you travel or work in places where you may be exposed to hepatitis B. Haemophilus influenzae type b (Hib) vaccine  You may need this if you have certain conditions. You may receive vaccines as individual doses or as more than one vaccine together in one shot (combination vaccines). Talk with your health care provider about the risks and benefits of combination vaccines. What tests do I need?  Blood tests  Lipid and cholesterol levels. These may be checked every 5 years starting at age 31.  Hepatitis C test.  Hepatitis B test. Screening  Diabetes screening. This is done by checking your blood sugar (glucose) after you have not eaten for a while (fasting).  Sexually transmitted disease (STD) testing.  BRCA-related cancer screening. This may be done if you have a family history of breast, ovarian, tubal, or peritoneal cancers.  Pelvic exam and Pap test. This may be done every 3 years starting at age 31. Starting at age 70, this may be done every 5 years if you have a Pap test in combination with an HPV test. Talk with your health care provider about your test results, treatment options, and if necessary, the need for more tests.  Follow these instructions at home: Eating and drinking   Eat a diet that includes fresh fruits and vegetables, whole grains, lean protein, and low-fat dairy.  Take vitamin and mineral supplements as recommended by your health care provider.  Do not drink alcohol if: ? Your health care provider tells you not to drink. ? You are  pregnant, may be pregnant, or are planning to become pregnant.  If you drink alcohol: ? Limit how much you have to 0-1 drink a day. ? Be aware of how much alcohol is in your drink. In the [place] one drink equals one 12 oz bottle of beer (355 mL), one 5 oz glass of wine (148 mL), or one 1 oz glass of hard liquor (44 mL). Lifestyle  Take daily care of your teeth and gums.  Stay active. Exercise for at least 30 minutes on 5 or more days each week.  Do not use any products that contain nicotine or tobacco, such as cigarettes, e-cigarettes, and chewing tobacco. If you need help quitting, ask your health care provider.  If you are sexually active, practice safe sex. Use a condom or other form of birth control (contraception) in order to prevent pregnancy and STIs (sexually transmitted infections). If you plan to become pregnant, see your health care provider for a preconception visit. What's next?  Visit your health care provider once a year for a well check visit.  Ask your health care provider how often you should have your eyes and teeth checked.  Stay up to date on all vaccines. This information is not intended to replace advice given to you by your health care provider. Make sure you discuss any questions you have with your health care provider. Document Released: 10/10/2001 Document Revised: 04/25/2018 Document Reviewed: 04/25/2018 Elsevier Patient Education  2020 [employer].
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
## 2019-07-11 NOTE — Assessment & Plan Note (Signed)
Urine pregnancy test was negative Encouraged patient to look further into contraception if she is not interested in getting pregnant at this time

## 2019-07-22 ENCOUNTER — Ambulatory Visit: Payer: BC Managed Care – PPO

## 2019-12-05 ENCOUNTER — Ambulatory Visit: Payer: BC Managed Care – PPO

## 2019-12-06 ENCOUNTER — Ambulatory Visit: Payer: BC Managed Care – PPO | Attending: Internal Medicine

## 2019-12-06 ENCOUNTER — Ambulatory Visit: Payer: BC Managed Care – PPO

## 2019-12-06 DIAGNOSIS — Z23 Encounter for immunization: Secondary | ICD-10-CM

## 2019-12-06 NOTE — Progress Notes (Signed)
   Covid-19 Vaccination Clinic  Name:  Deanna Newman    MRN: [id number] DOB: [birth year]  12/06/2019  [name] was observed post Covid-19 immunization for 15 minutes without incident. She was provided with Vaccine Information Sheet and instruction to access the V-Safe system.   [name] was instructed to call 911 with any severe reactions post vaccine: Marland Kitchen Difficulty breathing  . Swelling of face and throat  . A fast heartbeat  . A bad rash all over body  . Dizziness and weakness   Immunizations Administered    Name Date Dose VIS Date Route   Pfizer COVID-19 Vaccine 12/06/2019  9:04 AM 0.3 mL 08/08/2019 Intramuscular   Manufacturer: [employer], [employer]   Lot: [id number]   NDC: [address]-1

## 2019-12-30 ENCOUNTER — Ambulatory Visit: Payer: BC Managed Care – PPO | Attending: Internal Medicine

## 2019-12-30 DIAGNOSIS — Z23 Encounter for immunization: Secondary | ICD-10-CM

## 2019-12-30 NOTE — Progress Notes (Signed)
   Covid-19 Vaccination Clinic  Name:  Deanna Newman    MRN: [id number] DOB: 1985/12/11  12/30/2019  Deanna Newman was observed post Covid-19 immunization for 15 minutes without incident. She was provided with Vaccine Information Sheet and instruction to access the V-Safe system.   Deanna Newman was instructed to call 911 with any severe reactions post vaccine: Marland Kitchen Difficulty breathing  . Swelling of face and throat  . A fast heartbeat  . A bad rash all over body  . Dizziness and weakness   Immunizations Administered    Name Date Dose VIS Date Route   Pfizer COVID-19 Vaccine 12/30/2019 12:12 PM 0.3 mL 10/22/2018 Intramuscular   Manufacturer: [employer], [employer]   Lot: [id number]   NDC: [address]-2

## 2020-03-24 ENCOUNTER — Other Ambulatory Visit (HOSPITAL_COMMUNITY)
Admission: RE | Admit: 2020-03-24 | Discharge: 2020-03-24 | Disposition: A | Discharge status: Home / Self Care | Payer: BC Managed Care – PPO | Source: Ambulatory Visit | Attending: Obstetrics and Gynecology | Admitting: Obstetrics and Gynecology

## 2020-03-24 ENCOUNTER — Ambulatory Visit (INDEPENDENT_AMBULATORY_CARE_PROVIDER_SITE_OTHER): Payer: BC Managed Care – PPO | Admitting: Obstetrics and Gynecology

## 2020-03-24 ENCOUNTER — Encounter: Payer: Self-pay | Admitting: Obstetrics and Gynecology

## 2020-03-24 VITALS — BP 129/83 | HR 78 | Ht 65.0 in | Wt 290.4 lb

## 2020-03-24 DIAGNOSIS — Z01419 Encounter for gynecological examination (general) (routine) without abnormal findings: Secondary | ICD-10-CM | POA: Insufficient documentation

## 2020-03-24 DIAGNOSIS — Z3169 Encounter for other general counseling and advice on procreation: Secondary | ICD-10-CM | POA: Diagnosis not present

## 2020-03-24 DIAGNOSIS — Z131 Encounter for screening for diabetes mellitus: Secondary | ICD-10-CM | POA: Diagnosis not present

## 2020-03-24 DIAGNOSIS — Z6841 Body Mass Index (BMI) 40.0 and over, adult: Secondary | ICD-10-CM

## 2020-03-24 DIAGNOSIS — Z8742 Personal history of other diseases of the female genital tract: Secondary | ICD-10-CM

## 2020-03-24 NOTE — Progress Notes (Signed)
Pt present for annual exam. Pt stated that she would like to discuss her weight management; pt made changes in diet and still no weight loss. Pt also stated that she would like to conceive and have another child soon. Pt had both COVID vaccine injections. Pt denies any gyn issues at this time.

## 2020-03-24 NOTE — Progress Notes (Signed)
GYNECOLOGY ANNUAL PHYSICAL EXAM PROGRESS NOTE  Subjective:    Deanna Newman is a 34 y.o. [phone] female who presents for an annual exam.   The patient has the following complaints today:  1. She has concerns about weight loss, stating she has changed her diet (no soda, more water, avoiding fried foods, watching what she is eating), started going to the gym but has not seen any progress. She states she sees inches going down but no pounds. In the past she has tried taking Slimvance (from [facility]) and states she felt she had increased energy, sweat more and lost 5 pounds, however as soon as she stopped taking it, she gained the weight back. She would like to be as healthy as she can prior to another pregnancy for her own health as well as for the baby's.   2. Patient would like to have another child soon. She has concerns because she has been high risk with her past 2 pregnancies and also has a history of miscarriage.      Gynecologic History  Patient's last menstrual period was 03/15/2020. Irregular, first started back in 09/2019, skips months occasionally, was not normal prior to pregnancy Contraception: none History of STI's: 2015, chlamydia.  Last Pap: 09/2016, normal.  History of one abnormal pap in ~2011 per pt.    OB History  Gravida Para Term Preterm AB Living  3 2 2  0 1 2  SAB TAB Ectopic Multiple Live Births  1 0 0 0 2    # Outcome Date GA Lbr Len/2nd Weight Sex Delivery Anes PTL Lv  3 Term 07/31/18 [redacted]w[redacted]d  3780 g M CS-LTranv Spinal  LIV     Name: Boatman,BOY Halima     Apgar1: 9  Apgar5: 9  2 SAB 11/11/16 [redacted]w[redacted]d         1 Term 05/23/12 [redacted]w[redacted]d  3742 g [redacted]w[redacted]d   LIV    Past Medical History:  Diagnosis Date  . Anemia   . Asthma   . Cataract   . Gestational hypertension   . Migraine   . Vaginal Pap smear, abnormal 2010   followed by colposcopy    Past Surgical History:  Procedure Laterality Date  . CESAREAN SECTION    . CESAREAN SECTION N/A 07/31/2018    Procedure: REPEAT CESAREAN SECTION;  Surgeon: 14/11/2017, MD;  Location: [facility] ORS;  Service: Obstetrics;  Laterality: N/A;  Time of Birth; 15:08 Sex: Female Wt: 8 LB 5 oz  . WISDOM TOOTH EXTRACTION      Family History  Problem Relation Age of Onset  . Skin cancer Mother   . GER disease Mother   . Diabetes Father   . COPD Father        smoker  . Asthma Father   . Hypertension Father   . Heart failure Father   . Hypertension Sister   . Anxiety disorder Sister   . COPD Paternal Grandmother   . Heart disease Paternal Grandmother   . Diabetes Paternal Grandmother   . Lung cancer Paternal Grandmother        smoker  . Skin cancer Maternal Grandmother     Social History   Socioeconomic History  . Marital status: Divorced    Spouse name: Not on file  . Number of children: 1  . Years of education: Not on file  . Highest education level: Not on file  Occupational History  . Occupation: [employer]  Tobacco Use  . Smoking status:  Never Smoker  . Smokeless tobacco: Never Used  Vaping Use  . Vaping Use: Never used  Substance and Sexual Activity  . Alcohol use: Yes    Comment: Occasionally   . Drug use: No  . Sexual activity: Yes    Birth control/protection: None  Other Topics Concern  . Not on file  Social History Narrative  . Not on file   Social Determinants of Health   Financial Resource Strain:   . Difficulty of Paying Living Expenses:   Food Insecurity:   . Worried About Programme researcher, broadcasting/film/video in the Last Year:   . Barista in the Last Year:   Transportation Needs:   . Freight forwarder (Medical):   Marland Kitchen Lack of Transportation (Non-Medical):   Physical Activity:   . Days of Exercise per Week:   . Minutes of Exercise per Session:   Stress:   . Feeling of Stress :   Social Connections:   . Frequency of Communication with Friends and Family:   . Frequency of Social Gatherings with Friends and Family:   . Attends Religious Services:   . Active  Member of Clubs or Organizations:   . Attends Banker Meetings:   Marland Kitchen Marital Status:   Intimate Partner Violence:   . Fear of Current or Ex-Partner:   . Emotionally Abused:   Marland Kitchen Physically Abused:   . Sexually Abused:     Current Outpatient Medications on File Prior to Visit  Medication Sig Dispense Refill  . albuterol (PROVENTIL HFA;VENTOLIN HFA) 108 (90 Base) MCG/ACT inhaler Inhale 2 puffs into the lungs every 6 (six) hours as needed for wheezing or shortness of breath. 1 Inhaler 2   No current facility-administered medications on file prior to visit.    Allergies  Allergen Reactions  . Fentanyl Itching    Makes pt want to scratch skin off   . Sumatriptan Anaphylaxis      Review of Systems Constitutional: negative for chills, fatigue, fevers and sweats. Intentional weight loss of 6 pounds. Eyes: negative for irritation, redness and visual disturbance Ears, nose, mouth, throat, and face: negative for hearing loss, nasal congestion, snoring and tinnitus Respiratory: negative for asthma, cough, sputum Cardiovascular: negative for chest pain, dyspnea, exertional chest pressure/discomfort, irregular heart beat, palpitations and syncope Gastrointestinal: negative for abdominal pain, change in bowel habits, nausea and vomiting Genitourinary: negative for abnormal menstrual periods, vaginal discharge, dysuria, frequency, urinary incontinence.  Integument/breast: negative for breast lump, breast tenderness and nipple discharge Hematologic/lymphatic: negative for bleeding and easy bruising Musculoskeletal:negative for back pain and muscle weakness Neurological: negative for dizziness, headaches, vertigo and weakness Endocrine: negative for diabetic symptoms including polydipsia, polyuria and skin dryness Allergic/Immunologic: negative for hay fever and urticaria        Objective:  Blood pressure (!) 129/83, pulse 78, height 5\' 5"  (1.651 m), weight (!) 131.7 kg, last  menstrual period 03/15/2020, currently breastfeeding. Body mass index is 48.33 kg/m.  General Appearance:    Alert, cooperative, no distress, appears stated age, morbid obesity  Head:    Normocephalic, without obvious abnormality, atraumatic  Eyes:    PERRL, conjunctiva/corneas clear, EOM's intact, both eyes  Ears:    Normal external ear canals, both ears  Nose:   Nares normal, septum midline, mucosa normal, no drainage or sinus tenderness  Throat:   Lips, mucosa, and tongue normal; teeth and gums normal  Neck:   Supple, symmetrical, trachea midline, no adenopathy; thyroid: no enlargement/tenderness/nodules; no carotid bruit  or JVD  Back:     Symmetric, no curvature, ROM normal, no CVA tenderness  Lungs:     Clear to auscultation bilaterally, respirations unlabored  Chest Wall:    No tenderness or deformity   Heart:    Regular rate and rhythm, S1 and S2 normal, no murmur, rub or gallop  Breast Exam:    No tenderness, masses, or nipple abnormality  Abdomen:     Soft, non-tender, bowel sounds active all four quadrants, no masses, no organomegaly.    Genitalia:    Pelvic:external genitalia normal. Vagina with scant thin white discharge, no odor.  Cervix appears normal, no lesions. Uterus normal size, shape, mobile, regular contours, nontender.   Rectal:    Normal external sphincter.  No hemorrhoids appreciated. Internal exam not done.   Extremities:   Extremities normal, atraumatic, no cyanosis or edema  Pulses:   2+ and symmetric all extremities  Skin:   Skin color, texture, turgor normal, no rashes or lesions  Lymph nodes:   Cervical, supraclavicular, and axillary nodes normal  Neurologic:   CNII-XII intact, normal strength, sensation and reflexes throughout   .  Labs:   Lab Results  Component Value Date   WBC 9.3 03/18/2019   HGB 11.0 (L) 03/18/2019   HCT 35.0 03/18/2019   MCV 72 (L) 03/18/2019   PLT 388 03/18/2019    Lab Results  Component Value Date   CREATININE 0.67  03/18/2019   BUN 11 03/18/2019   NA 141 03/18/2019   K 3.9 03/18/2019   CL 104 03/18/2019   CO2 20 03/18/2019    Lab Results  Component Value Date   ALT 17 03/18/2019   AST 17 03/18/2019   ALKPHOS 118 (H) 03/18/2019   BILITOT <0.2 03/18/2019    Lab Results  Component Value Date   TSH 1.800 12/31/2017    Lab Results  Component Value Date   HGBA1C 5.5 03/18/2019    Assessment:   - Encounter for well woman exam with routine gynecological exam - Morbid obesity with BMI of 45.0-49.9, adult (HCC) - Preconception counseling - History of irregular menses  Plan:   - Blood tests: CBC with diff, Comprehensive metabolic panel, Lipoproteins and TSH. - Breast self exam technique reviewed and patient encouraged to perform self-exam monthly. - Contraception: none. - Discussed healthy lifestyle modifications. - Pap smear up to date. - Patient currently has questions regarding weight loss management. Wonders if she should resume her medication as she is fearful of rebound weight loss again.  Discussed continuing to incorporate lifestyle and dietary changes even after weight loss achieved. Can resume weight loss supplement, but should use barrier contraception until October when she plans to attempt conception.  Patient wonders if she is too high risk to attempt conception again. Discussed risk factors of obesity and prior C-section x 2.  Advised that she can lower her risk with weight loss prior to pregnancy and limiting weight loss during a pregnancy. Also briefly discussed that patient still able for [facility] after 2 C-sections. To begin PNV and folic acid supplement once attempts at conception occur.  - History of irregular menses, notes fertility tracking with ovulation kits for last pregnancy, will utilize again.  - Follow up in 1 year, or sooner if pregnancy occurs.    I have seen and examined the patient with Ignacia Bayley, [place] PA-S.  I have reviewed the record and concur with patient  management and plan.   Hildred Laser, MD [facility]

## 2020-03-24 NOTE — Patient Instructions (Addendum)
Health Maintenance, Female Adopting a healthy lifestyle and getting preventive care are important in promoting health and wellness. Ask your health care provider about:  The right schedule for you to have regular tests and exams.  Things you can do on your own to prevent diseases and keep yourself healthy. What should I know about diet, weight, and exercise? Eat a healthy diet   Eat a diet that includes plenty of vegetables, fruits, low-fat dairy products, and lean protein.  Do not eat a lot of foods that are high in solid fats, added sugars, or sodium. Maintain a healthy weight Body mass index (BMI) is used to identify weight problems. It estimates body fat based on height and weight. Your health care provider can help determine your BMI and help you achieve or maintain a healthy weight. Get regular exercise Get regular exercise. This is one of the most important things you can do for your health. Most adults should:  Exercise for at least 150 minutes each week. The exercise should increase your heart rate and make you sweat (moderate-intensity exercise).  Do strengthening exercises at least twice a week. This is in addition to the moderate-intensity exercise.  Spend less time sitting. Even light physical activity can be beneficial. Watch cholesterol and blood lipids Have your blood tested for lipids and cholesterol at 32 years of age, then have this test every 5 years. Have your cholesterol levels checked more often if:  Your lipid or cholesterol levels are high.  You are older than 32 years of age.  You are at high risk for heart disease. What should I know about cancer screening? Depending on your health history and family history, you may need to have cancer screening at various ages. This may include screening for:  Breast cancer.  Cervical cancer.  Colorectal cancer.  Skin cancer.  Lung cancer. What should I know about heart disease, diabetes, and high blood  pressure? Blood pressure and heart disease  High blood pressure causes heart disease and increases the risk of stroke. This is more likely to develop in people who have high blood pressure readings, are of African descent, or are overweight.  Have your blood pressure checked: ? Every 3-5 years if you are 18-39 years of age. ? Every year if you are 40 years old or older. Diabetes Have regular diabetes screenings. This checks your fasting blood sugar level. Have the screening done:  Once every three years after age 40 if you are at a normal weight and have a low risk for diabetes.  More often and at a younger age if you are overweight or have a high risk for diabetes. What should I know about preventing infection? Hepatitis B If you have a higher risk for hepatitis B, you should be screened for this virus. Talk with your health care provider to find out if you are at risk for hepatitis B infection. Hepatitis C Testing is recommended for:  Everyone born from 1945 through 1965.  Anyone with known risk factors for hepatitis C. Sexually transmitted infections (STIs)  Get screened for STIs, including gonorrhea and chlamydia, if: ? You are sexually active and are younger than 32 years of age. ? You are older than 32 years of age and your health care provider tells you that you are at risk for this type of infection. ? Your sexual activity has changed since you were last screened, and you are at increased risk for chlamydia or gonorrhea. Ask your health care provider if   you are at risk.  Ask your health care provider about whether you are at high risk for HIV. Your health care provider may recommend a prescription medicine to help prevent HIV infection. If you choose to take medicine to prevent HIV, you should first get tested for HIV. You should then be tested every 3 months for as long as you are taking the medicine. Pregnancy  If you are about to stop having your period (premenopausal) and  you may become pregnant, seek counseling before you get pregnant.  Take 400 to 800 micrograms (mcg) of folic acid every day if you become pregnant.  Ask for birth control (contraception) if you want to prevent pregnancy. Osteoporosis and menopause Osteoporosis is a disease in which the bones lose minerals and strength with aging. This can result in bone fractures. If you are 65 years old or older, or if you are at risk for osteoporosis and fractures, ask your health care provider if you should:  Be screened for bone loss.  Take a calcium or vitamin D supplement to lower your risk of fractures.  Be given hormone replacement therapy (HRT) to treat symptoms of menopause. Follow these instructions at home: Lifestyle  Do not use any products that contain nicotine or tobacco, such as cigarettes, e-cigarettes, and chewing tobacco. If you need help quitting, ask your health care provider.  Do not use street drugs.  Do not share needles.  Ask your health care provider for help if you need support or information about quitting drugs. Alcohol use  Do not drink alcohol if: ? Your health care provider tells you not to drink. ? You are pregnant, may be pregnant, or are planning to become pregnant.  If you drink alcohol: ? Limit how much you use to 0-1 drink a day. ? Limit intake if you are breastfeeding.  Be aware of how much alcohol is in your drink. In the [place] one drink equals one 12 oz bottle of beer (355 mL), one 5 oz glass of wine (148 mL), or one 1 oz glass of hard liquor (44 mL). General instructions  Schedule regular health, dental, and eye exams.  Stay current with your vaccines.  Tell your health care provider if: ? You often feel depressed. ? You have ever been abused or do not feel safe at home. Summary  Adopting a healthy lifestyle and getting preventive care are important in promoting health and wellness.  Follow your health care provider's instructions about healthy  diet, exercising, and getting tested or screened for diseases.  Follow your health care provider's instructions on monitoring your cholesterol and blood pressure. This information is not intended to replace advice given to you by your health care provider. Make sure you discuss any questions you have with your health care provider. Document Revised: 08/07/2018 Document Reviewed: 08/07/2018 Elsevier Patient Education  2020 [employer] Inc.   Breast Self-Awareness Breast self-awareness means being familiar with how your breasts look and feel. It involves checking your breasts regularly and reporting any changes to your health care provider. Practicing breast self-awareness is important. Sometimes changes may not be harmful (are benign), but sometimes a change in your breasts can be a sign of a serious medical problem. It is important to learn how to do this procedure correctly so that you can catch problems early, when treatment is more likely to be successful. All women should practice breast self-awareness, including women who have had breast implants. What you need:  A mirror.  A well-lit room.   How to do a breast self-exam A breast self-exam is one way to learn what is normal for your breasts and whether your breasts are changing. To do a breast self-exam: Look for changes  1. Remove all the clothing above your waist. 2. Stand in front of a mirror in a room with good lighting. 3. Put your hands on your hips. 4. Push your hands firmly downward. 5. Compare your breasts in the mirror. Look for differences between them (asymmetry), such as: ? Differences in shape. ? Differences in size. ? Puckers, dips, and bumps in one breast and not the other. 6. Look at each breast for changes in the skin, such as: ? Redness. ? Scaly areas. 7. Look for changes in your nipples, such as: ? Discharge. ? Bleeding. ? Dimpling. ? Redness. ? A change in position. Feel for changes Carefully feel your  breasts for lumps and changes. It is best to do this while lying on your back on the floor, and again while sitting or standing in the tub or shower with soapy water on your skin. Feel each breast in the following way: 1. Place the arm on the side of the breast you are examining above your head. 2. Feel your breast with the other hand. 3. Start in the nipple area and make -inch (2 cm) overlapping circles to feel your breast. Use the pads of your three middle fingers to do this. Apply light pressure, then medium pressure, then firm pressure. The light pressure will allow you to feel the tissue closest to the skin. The medium pressure will allow you to feel the tissue that is a little deeper. The firm pressure will allow you to feel the tissue close to the ribs. 4. Continue the overlapping circles, moving downward over the breast until you feel your ribs below your breast. 5. Move one finger-width toward the center of the body. Continue to use the -inch (2 cm) overlapping circles to feel your breast as you move slowly up toward your collarbone. 6. Continue the up-and-down exam using all three pressures until you reach your armpit.  Write down what you find Writing down what you find can help you remember what to discuss with your health care provider. Write down:  What is normal for each breast.  Any changes that you find in each breast, including: ? The kind of changes you find. ? Any pain or tenderness. ? Size and location of any lumps.  Where you are in your menstrual cycle, if you are still menstruating. General tips and recommendations  Examine your breasts every month.  If you are breastfeeding, the best time to examine your breasts is after a feeding or after using a breast pump.  If you menstruate, the best time to examine your breasts is 5-7 days after your period. Breasts are generally lumpier during menstrual periods, and it may be more difficult to notice changes.  With time  and practice, you will become more familiar with the variations in your breasts and more comfortable with the exam. Contact a health care provider if you:  See a change in the shape or size of your breasts or nipples.  See a change in the skin of your breast or nipples, such as a reddened or scaly area.  Have unusual discharge from your nipples.  Find a lump or thick area that was not there before.  Have pain in your breasts.  Have any concerns related to your breast health. Summary  Breast self-awareness   includes looking for physical changes in your breasts, as well as feeling for any changes within your breasts.  Breast self-awareness should be performed in front of a mirror in a well-lit room.  You should examine your breasts every month. If you menstruate, the best time to examine your breasts is 5-7 days after your menstrual period.  Let your health care provider know of any changes you notice in your breasts, including changes in size, changes on the skin, pain or tenderness, or unusual fluid from your nipples. This information is not intended to replace advice given to you by your health care provider. Make sure you discuss any questions you have with your health care provider. Document Revised: 04/02/2018 Document Reviewed: 04/02/2018 Elsevier Patient Education  2020 [employer].      Preparing for Pregnancy If you are considering becoming pregnant, make an appointment to see your regular health care provider to learn how to prepare for a safe and healthy pregnancy (preconception care). During a preconception care visit, your health care provider will:  Do a complete physical exam, including a Pap test.  Take a complete medical history.  Give you information, answer your questions, and help you resolve problems. Preconception checklist Medical history  Tell your health care provider about any current or past medical conditions. Your pregnancy or your ability to  become pregnant may be affected by chronic conditions, such as diabetes, chronic hypertension, and thyroid problems.  Include your family's medical history as well as your partner's medical history.  Tell your health care provider about any history of STIs (sexually transmitted infections).These can affect your pregnancy. In some cases, they can be passed to your baby. Discuss any concerns that you have about STIs.  If indicated, discuss the benefits of genetic testing. This testing will show whether there are any genetic conditions that may be passed from you or your partner to your baby.  Tell your health care provider about: ? Any problems you have had with conception or pregnancy. ? Any medicines you take. These include vitamins, herbal supplements, and over-the-counter medicines. ? Your history of immunizations. Discuss any vaccinations that you may need. Diet  Ask your health care provider what to include in a healthy diet that has a balance of nutrients. This is especially important when you are pregnant or preparing to become pregnant.  Ask your health care provider to help you reach a healthy weight before pregnancy. ? If you are overweight, you may be at higher risk for certain complications, such as high blood pressure, diabetes, and preterm birth. ? If you are underweight, you are more likely to have a baby who has a low birth weight. Lifestyle, work, and home  Let your health care provider know: ? About any lifestyle habits that you have, such as alcohol use, drug use, or smoking. ? About recreational activities that may put you at risk during pregnancy, such as downhill skiing and certain exercise programs. ? Tell your health care provider about any international travel, especially any travel to places with an active [place] virus outbreak. ? About harmful substances that you may be exposed to at work or at home. These include chemicals, pesticides, radiation, or even litter  boxes. ? If you do not feel safe at home. Mental health  Tell your health care provider about: ? Any history of mental health conditions, including feelings of depression, sadness, or anxiety. ? Any medicines that you take for a mental health condition. These include herbs and supplements.  Home instructions to prepare for pregnancy Lifestyle   Eat a balanced diet. This includes fresh fruits and vegetables, whole grains, lean meats, low-fat dairy products, healthy fats, and foods that are high in fiber. Ask to meet with a nutritionist or registered dietitian for assistance with meal planning and goals.  Get regular exercise. Try to be active for at least 30 minutes a day on most days of the week. Ask your health care provider which activities are safe during pregnancy.  Do not use any products that contain nicotine or tobacco, such as cigarettes and e-cigarettes. If you need help quitting, ask your health care provider.  Do not drink alcohol.  Do not take illegal drugs.  Maintain a healthy weight. Ask your health care provider what weight range is right for you. General instructions  Keep an accurate record of your menstrual periods. This makes it easier for your health care provider to determine your baby's due date.  Begin taking prenatal vitamins and folic acid supplements daily as directed by your health care provider.  Manage any chronic conditions, such as high blood pressure and diabetes, as told by your health care provider. This is important. How do I know that I am pregnant? You may be pregnant if you have been sexually active and you miss your period. Symptoms of early pregnancy include:  Mild cramping.  Very light vaginal bleeding (spotting).  Feeling unusually tired.  Nausea and vomiting (morning sickness). If you have any of these symptoms and you suspect that you might be pregnant, you can take a home pregnancy test. These tests check for a hormone in your urine  (human chorionic gonadotropin, or hCG). A woman's body begins to make this hormone during early pregnancy. These tests are very accurate. Wait until at least the first day after you miss your period to take one. If the test shows that you are pregnant (you get a positive result), call your health care provider to make an appointment for prenatal care. What should I do if I become pregnant?      Make an appointment with your health care provider as soon as you suspect you are pregnant.  Do not use any products that contain nicotine, such as cigarettes, chewing tobacco, and e-cigarettes. If you need help quitting, ask your health care provider.  Do not drink alcoholic beverages. Alcohol is related to a number of birth defects.  Avoid toxic odors and chemicals.  You may continue to have sexual intercourse if it does not cause pain or other problems, such as vaginal bleeding. This information is not intended to replace advice given to you by your health care provider. Make sure you discuss any questions you have with your health care provider. Document Revised: 08/16/2017 Document Reviewed: 03/05/2016 Elsevier Patient Education  2020 Elsevier Inc.  Preventing Unhealthy [employer], Adult Staying at a healthy weight is important to your overall health. When fat builds up in your body, you may become overweight or obese. Being overweight or obese increases your risk of developing certain health problems, such as heart disease, diabetes, sleeping problems, joint problems, and some types of cancer. Unhealthy weight gain is often the result of making unhealthy food choices or not getting enough exercise. You can make changes to your lifestyle to prevent obesity and stay as healthy as possible. What nutrition changes can be made?   Eat only as much as your body needs. To do this: ? Pay attention to signs that you are hungry or full.  Stop eating as soon as you feel full. ? If you feel hungry, try  drinking water first before eating. Drink enough water so your urine is clear or pale yellow. ? Eat smaller portions. Pay attention to portion sizes when eating out. ? Look at serving sizes on food labels. Most foods contain more than one serving per container. ? Eat the recommended number of calories for your gender and activity level. For most active people, a daily total of 2,000 calories is appropriate. If you are trying to lose weight or are not very active, you may need to eat fewer calories. Talk with your health care provider or a diet and nutrition specialist (dietitian) about how many calories you need each day.  Choose healthy foods, such as: ? Fruits and vegetables. At each meal, try to fill at least half of your plate with fruits and vegetables. ? Whole grains, such as whole-wheat bread, brown rice, and quinoa. ? Lean meats, such as chicken or fish. ? Other healthy proteins, such as beans, eggs, or tofu. ? Healthy fats, such as nuts, seeds, fatty fish, and olive oil. ? Low-fat or fat-free dairy products.  Check food labels, and avoid food and drinks that: ? Are high in calories. ? Have added sugar. ? Are high in sodium. ? Have saturated fats or trans fats.  Cook foods in healthier ways, such as by baking, broiling, or grilling.  Make a meal plan for the week, and shop with a grocery list to help you stay on track with your purchases. Try to avoid going to the grocery store when you are hungry.  When grocery shopping, try to shop around the outside of the store first, where the fresh foods are. Doing this helps you to avoid prepackaged foods, which can be high in sugar, salt (sodium), and fat. What lifestyle changes can be made?   Exercise for 30 or more minutes on 5 or more days each week. Exercising may include brisk walking, yard work, biking, running, swimming, and team sports like basketball and soccer. Ask your health care provider which exercises are safe for you.  Do  muscle-strengthening activities, such as lifting weights or using resistance bands, on 2 or more days a week.  Do not use any products that contain nicotine or tobacco, such as cigarettes and e-cigarettes. If you need help quitting, ask your health care provider.  Limit alcohol intake to no more than 1 drink a day for nonpregnant women and 2 drinks a day for men. One drink equals 12 oz of beer, 5 oz of wine, or 1 oz of hard liquor.  Try to get 7-9 hours of sleep each night. What other changes can be made?  Keep a food and activity journal to keep track of: ? What you ate and how many calories you had. Remember to count the calories in sauces, dressings, and side dishes. ? Whether you were active, and what exercises you did. ? Your calorie, weight, and activity goals.  Check your weight regularly. Track any changes. If you notice you have gained weight, make changes to your diet or activity routine.  Avoid taking weight-loss medicines or supplements. Talk to your health care provider before starting any new medicine or supplement.  Talk to your health care provider before trying any new diet or exercise plan. Why are these changes important? Eating healthy, staying active, and having healthy habits can help you to prevent obesity. Those changes also:  Help you manage stress and emotions.  Help you connect with friends and family.  Improve your self-esteem.  Improve your sleep.  Prevent long-term health problems. What can happen if changes are not made? Being obese or overweight can cause you to develop joint or bone problems, which can make it hard for you to stay active or do activities you enjoy. Being obese or overweight also puts stress on your heart and lungs and can lead to health problems like diabetes, heart disease, and some cancers. Where to find more information Talk with your health care provider or a dietitian about healthy eating and healthy lifestyle choices. You may  also find information from:  [place] Department of Agriculture, My[employer]ate: https://ball-collins.biz/  American Heart Association: www.heart.org  Centers for Disease Control and Prevention: [employer]Ex[employer].br Summary  Staying at a healthy weight is important to your overall health. It helps you to prevent certain diseases and health problems, such as heart disease, diabetes, joint problems, sleep disorders, and some types of cancer.  Being obese or overweight can cause you to develop joint or bone problems, which can make it hard for you to stay active or do activities you enjoy.  You can prevent unhealthy weight gain by eating a healthy diet, exercising regularly, not smoking, limiting alcohol, and getting enough sleep.  Talk with your health care provider or a dietitian for guidance about healthy eating and healthy lifestyle choices. This information is not intended to replace advice given to you by your health care provider. Make sure you discuss any questions you have with your health care provider. Document Revised: 08/17/2017 Document Reviewed: 09/20/2016 Elsevier Patient Education  2020 [employer].

## 2020-03-25 LAB — CBC
Hematocrit: 35 % (ref 34.0–46.6)
Hemoglobin: 10.7 g/dL — ABNORMAL LOW (ref 11.1–15.9)
MCH: 22.9 pg — ABNORMAL LOW (ref 26.6–33.0)
MCHC: 30.6 g/dL — ABNORMAL LOW (ref 31.5–35.7)
MCV: 75 fL — ABNORMAL LOW (ref 79–97)
Platelets: 418 10*3/uL (ref 150–450)
RBC: 4.68 x10E6/uL (ref 3.77–5.28)
RDW: 14.7 % (ref 11.7–15.4)
WBC: 9.2 10*3/uL (ref 3.4–10.8)

## 2020-03-25 LAB — COMPREHENSIVE METABOLIC PANEL
ALT: 12 IU/L (ref 0–32)
AST: 17 IU/L (ref 0–40)
Albumin/Globulin Ratio: 1.4 (ref 1.2–2.2)
Albumin: 4.2 g/dL (ref 3.8–4.8)
Alkaline Phosphatase: 94 IU/L (ref 48–121)
BUN/Creatinine Ratio: 17 (ref 9–23)
BUN: 12 mg/dL (ref 6–20)
Bilirubin Total: <0.2 mg/dL (ref 0.0–1.2)
CO2: 23 mmol/L (ref 20–29)
Calcium: 9 mg/dL (ref 8.7–10.2)
Chloride: 103 mmol/L (ref 96–106)
Creatinine, Ser: 0.69 mg/dL (ref 0.57–1.00)
GFR calc Af Amer: 133 mL/min/{1.73_m2} (ref >59)
GFR calc non Af Amer: 116 mL/min/{1.73_m2} (ref >59)
Globulin, Total: 3.1 g/dL (ref 1.5–4.5)
Glucose: 94 mg/dL (ref 65–99)
Potassium: 4.1 mmol/L (ref 3.5–5.2)
Sodium: 138 mmol/L (ref 134–144)
Total Protein: 7.3 g/dL (ref 6.0–8.5)

## 2020-03-25 LAB — LIPID PANEL
Chol/HDL Ratio: 5.1 ratio — ABNORMAL HIGH (ref 0.0–4.4)
Cholesterol, Total: 157 mg/dL (ref 100–199)
HDL: 31 mg/dL — ABNORMAL LOW (ref >39)
LDL Chol Calc (NIH): 93 mg/dL (ref 0–99)
Triglycerides: 192 mg/dL — ABNORMAL HIGH (ref 0–149)
VLDL Cholesterol Cal: 33 mg/dL (ref 5–40)

## 2020-03-25 LAB — HEMOGLOBIN A1C
Est. average glucose Bld gHb Est-mCnc: 114 mg/dL
Hgb A1c MFr Bld: 5.6 % (ref 4.8–5.6)

## 2020-03-25 LAB — TSH: TSH: 1.21 u[IU]/mL (ref 0.450–4.500)

## 2020-03-30 LAB — CYTOLOGY - PAP
Comment: NEGATIVE
Diagnosis: NEGATIVE
High risk HPV: NEGATIVE

## 2020-07-16 ENCOUNTER — Encounter: Payer: BC Managed Care – PPO | Admitting: Family Medicine

## 2020-09-13 NOTE — Progress Notes (Signed)
Complete physical exam   Patient: Deanna Newman   DOB: 09-19-1985   34 y.o. Female  MRN: [id number] Visit Date: 09/14/2020  Today's healthcare provider: Shirlee Latch, MD   Chief Complaint  Patient presents with   Annual Exam   Subjective    Deanna Newman is a 35 y.o. female who presents today for a complete physical exam.  She reports consuming a general diet. Exercises regularly. She generally feels well. She reports sleeping fairly well. She does have additional problems to discuss today.  Asthma She complains of shortness of breath. There is no cough or wheezing. This is a chronic problem. Pertinent negatives include no myalgias. Her past medical history is significant for asthma.  Knee Pain  The pain is present in the right knee.   knee was swollen but now better after mixing it up at the gym. No weakness, catching/locking.  Worse with deep knee bends, like in a squat.    Pt would like a refill on her inhaler.   Intermittent R sided chest pain. Worse this morning after only having coffee.  03/24/2020 PAP/HPV-Negative   Past Medical History:  Diagnosis Date   Anemia    Asthma    Cataract    Gestational hypertension    Migraine    Vaginal Pap smear, abnormal 2010   followed by colposcopy   Past Surgical History:  Procedure Laterality Date   CESAREAN SECTION     CESAREAN SECTION N/A 07/31/2018   Procedure: REPEAT CESAREAN SECTION;  Surgeon: Hildred Laser, MD;  Location: [facility] ORS;  Service: Obstetrics;  Laterality: N/A;  Time of Birth; 15:[birth year] Sex: Female Wt: 8 LB 5 oz   WISDOM TOOTH EXTRACTION     Social History   Socioeconomic History   Marital status: Divorced    Spouse name: Not on file   Number of children: 1   Years of education: Not on file   Highest education level: Not on file  Occupational History   Occupation: Admin Specialist  Tobacco Use   Smoking status: Never Smoker   Smokeless tobacco: Never Used  Haematologist Use: Never used  Substance and Sexual Activity   Alcohol use: Yes    Comment: Occasionally    Drug use: No   Sexual activity: Yes    Birth control/protection: None  Other Topics Concern   Not on file  Social History Narrative   Not on file   Social Determinants of Health   Financial Resource Strain: Not on file  Food Insecurity: Not on file  Transportation Needs: Not on file  Physical Activity: Not on file  Stress: Not on file  Social Connections: Not on file  Intimate Partner Violence: Not on file   Family Status  Relation Name Status   Mother  Alive   Father  Alive   Sister  Alive   PGM  Deceased   MGM  (Not Specified)   Brother  Alive   Family History  Problem Relation Age of Onset   Skin cancer Mother    GER disease Mother    Sleep apnea Mother    Diabetes Father    COPD Father        smoker   Asthma Father    Hypertension Father    Heart failure Father    Sleep apnea Father    Hypertension Sister    Anxiety disorder Sister    Obesity Sister    COPD Paternal Grandmother  Heart disease Paternal Grandmother    Diabetes Paternal Grandmother    Lung cancer Paternal Grandmother        smoker   Skin cancer Maternal Grandmother    Healthy Brother    Allergies  Allergen Reactions   Fentanyl Itching    Makes pt want to scratch skin off    Sumatriptan Anaphylaxis    Patient Care Team: Erasmo Downer, MD as PCP - General (Family Medicine)   Medications: Outpatient Medications Prior to Visit  Medication Sig   Specialty Vitamins Products (ULTIMATE FAT BURNER PO) Take by mouth.   [DISCONTINUED] albuterol (PROVENTIL HFA;VENTOLIN HFA) 108 (90 Base) MCG/ACT inhaler Inhale 2 puffs into the lungs every 6 (six) hours as needed for wheezing or shortness of breath.   No facility-administered medications prior to visit.    Review of Systems  Constitutional: Negative.   HENT: Negative.   Eyes: Negative.    Respiratory: Positive for shortness of breath. Negative for apnea, cough, choking, chest tightness, wheezing and stridor.   Cardiovascular: Negative.   Gastrointestinal: Negative.   Endocrine: Negative.   Genitourinary: Negative.   Musculoskeletal: Positive for arthralgias and joint swelling. Negative for back pain, gait problem, myalgias, neck pain and neck stiffness.  Skin: Negative.   Allergic/Immunologic: Negative.   Neurological: Negative.   Hematological: Negative.   Psychiatric/Behavioral: Negative.       Objective    BP (!) 138/91 (BP Location: Right Arm, Patient Position: Sitting, Cuff Size: Large)    Pulse 85    Temp 98.4 F (36.9 C) (Oral)    Ht 5' 5.75" (1.67 m)    Wt 280 lb (127 kg)    LMP 07/20/2020    BMI 45.54 kg/m    Physical Exam Vitals reviewed.  Constitutional:      General: She is not in acute distress.    Appearance: Normal appearance. She is well-developed. She is not diaphoretic.  HENT:     Head: Normocephalic and atraumatic.     Right Ear: Tympanic membrane, ear canal and external ear normal.     Left Ear: Tympanic membrane, ear canal and external ear normal.     Nose: Nose normal.     Mouth/Throat:     Mouth: Mucous membranes are moist.     Pharynx: Oropharynx is clear. No oropharyngeal exudate.  Eyes:     General: No scleral icterus.    Conjunctiva/sclera: Conjunctivae normal.     Pupils: Pupils are equal, round, and reactive to light.  Neck:     Thyroid: No thyromegaly.  Cardiovascular:     Rate and Rhythm: Normal rate and regular rhythm.     Pulses: Normal pulses.     Heart sounds: Normal heart sounds. No murmur heard.   Pulmonary:     Effort: Pulmonary effort is normal. No respiratory distress.     Breath sounds: Normal breath sounds. No wheezing or rales.  Abdominal:     General: There is no distension.     Palpations: Abdomen is soft.     Tenderness: There is no abdominal tenderness.  Musculoskeletal:     Cervical back: Neck  supple.     Right lower leg: No edema.     Left lower leg: No edema.     Comments: +genu valgum of both knees. R knee with normal strength, no tenderness on bony landmarks, ligaments are stable endpoints.  Lymphadenopathy:     Cervical: No cervical adenopathy.  Skin:    General: Skin is warm and  dry.     Findings: No rash.  Neurological:     Mental Status: She is alert and oriented to person, place, and time. Mental status is at baseline.     Sensory: No sensory deficit.     Motor: No weakness.     Gait: Gait normal.  Psychiatric:        Mood and Affect: Mood normal.        Behavior: Behavior normal.        Thought Content: Thought content normal.       Last depression screening scores PHQ 2/9 Scores 09/14/2020 07/11/2019 07/09/2018  PHQ - 2 Score 0 0 0  PHQ- 9 Score 3 6 6    Last fall risk screening Fall Risk  09/14/2020  Falls in the past year? 0  Number falls in past yr: 0  Injury with Fall? 0  Risk for fall due to : No Fall Risks  Follow up Falls evaluation completed   Last Audit-C alcohol use screening Alcohol Use Disorder Test (AUDIT) 09/14/2020  1. How often do you have a drink containing alcohol? 2  2. How many drinks containing alcohol do you have on a typical day when you are drinking? 1  3. How often do you have six or more drinks on one occasion? 1  AUDIT-C Score 4  Alcohol Brief Interventions/Follow-up -   A score of 3 or more in women, and 4 or more in men indicates increased risk for alcohol abuse, EXCEPT if all of the points are from question 1   No results found for any visits on 09/14/20.  Assessment & Plan    Routine Health Maintenance and Physical Exam  Exercise Activities and Dietary recommendations Goals   None     Immunization History  Administered Date(s) Administered   HPV Quadrivalent 08/23/2010   Influenza, Seasonal, Injecte, Preservative Fre 05/24/2012   Influenza,inj,Quad PF,6+ Mos 05/14/2018   Influenza-Unspecified 05/14/2018,  06/11/2020   PFIZER(Purple Top)SARS-COV-2 Vaccination 12/06/2019, 12/30/2019   Tdap 01/01/2012, 05/14/2018    Health Maintenance  Topic Date Due   Hepatitis C Screening  Never done   COVID-19 Vaccine (3 - Booster for Pfizer series) 07/01/2020   PAP SMEAR-Modifier  03/25/2023   TETANUS/TDAP  05/14/2028   INFLUENZA VACCINE  Completed   HIV Screening  Completed    Discussed health benefits of physical activity, and encouraged her to engage in regular exercise appropriate for her age and condition.  Problem List Items Addressed This Visit      Respiratory   Mild intermittent asthma    Fairly well-controlled without any recent exacerbations Uncomplicated Continue albuterol as needed-refilled today If is having daily symptoms and needing albuterol frequently, would consider starting ICS daily for better control      Relevant Medications   albuterol (VENTOLIN HFA) 108 (90 Base) MCG/ACT inhaler     Digestive   Gastroesophageal reflux disease without esophagitis    Intermittent sharp chest pain seems related to GERD No red flag symptoms Low heart score Discussed lifestyle changes to reduce GERD symptoms Can use H2 blocker as needed Tums as needed Return precautions discussed        Musculoskeletal and Integument   Acquired genu valgum of right knee    Seems to be the cause of intermittent knee pain Offered PT referral, but patient declines at this time She will continue to exercise Discussed RICE principles Stable knee on exam without need for imaging at this time Return precautions discussed  Other   Morbid obesity (HCC)    Discussed importance of healthy weight management Discussed diet and exercise       Relevant Orders   CBC   Lipid panel   Comprehensive metabolic panel   Anemia, iron deficiency    Recheck CBC and iron panel      Relevant Orders   CBC   Fe+TIBC+Fer    Other Visit Diagnoses    Encounter for annual physical exam    -   Primary   Relevant Orders   Hepatitis C Antibody   CBC   Lipid panel   Comprehensive metabolic panel   Fe+TIBC+Fer   Need for hepatitis C screening test       Relevant Orders   Hepatitis C Antibody   Moderate mixed hyperlipidemia not requiring statin therapy       Relevant Orders   Lipid panel   Comprehensive metabolic panel      We will schedule for COVID-vaccine booster appointment  Return in about 1 year (around 09/14/2021) for CPE.      Shirlee Latch, MD  [facility] [phone] (phone) [phone] (fax)  [facility]

## 2020-09-13 NOTE — Patient Instructions (Addendum)
Preventive Care 21-33 Years Old, Female Preventive care refers to lifestyle choices and visits with your health care provider that can promote health and wellness. This includes:  A yearly physical exam. This is also called an annual wellness visit.  Regular dental and eye exams.  Immunizations.  Screening for certain conditions.  Healthy lifestyle choices, such as: ? Eating a healthy diet. ? Getting regular exercise. ? Not using drugs or products that contain nicotine and tobacco. ? Limiting alcohol use. What can I expect for my preventive care visit? Physical exam Your health care provider may check your:  Height and weight. These may be used to calculate your BMI (body mass index). BMI is a measurement that tells if you are at a healthy weight.  Heart rate and blood pressure.  Body temperature.  Skin for abnormal spots. Counseling Your health care provider may ask you questions about your:  Past medical problems.  Family's medical history.  Alcohol, tobacco, and drug use.  Emotional well-being.  Home life and relationship well-being.  Sexual activity.  Diet, exercise, and sleep habits.  Work and work environment.  Access to firearms.  Method of birth control.  Menstrual cycle.  Pregnancy history. What immunizations do I need? Vaccines are usually given at various ages, according to a schedule. Your health care provider will recommend vaccines for you based on your age, medical history, and lifestyle or other factors, such as travel or where you work.   What tests do I need? Blood tests  Lipid and cholesterol levels. These may be checked every 5 years starting at age 20.  Hepatitis C test.  Hepatitis B test. Screening  Diabetes screening. This is done by checking your blood sugar (glucose) after you have not eaten for a while (fasting).  STD (sexually transmitted disease) testing, if you are at risk.  BRCA-related cancer screening. This may be  done if you have a family history of breast, ovarian, tubal, or peritoneal cancers.  Pelvic exam and Pap test. This may be done every 3 years starting at age 21. Starting at age 30, this may be done every 5 years if you have a Pap test in combination with an HPV test. Talk with your health care provider about your test results, treatment options, and if necessary, the need for more tests.   Follow these instructions at home: Eating and drinking  Eat a healthy diet that includes fresh fruits and vegetables, whole grains, lean protein, and low-fat dairy products.  Take vitamin and mineral supplements as recommended by your health care provider.  Do not drink alcohol if: ? Your health care provider tells you not to drink. ? You are pregnant, may be pregnant, or are planning to become pregnant.  If you drink alcohol: ? Limit how much you have to 0-1 drink a day. ? Be aware of how much alcohol is in your drink. In the [place] one drink equals one 12 oz bottle of beer (355 mL), one 5 oz glass of wine (148 mL), or one 1 oz glass of hard liquor (44 mL).   Lifestyle  Take daily care of your teeth and gums. Brush your teeth every morning and night with fluoride toothpaste. Floss one time each day.  Stay active. Exercise for at least 30 minutes 5 or more days each week.  Do not use any products that contain nicotine or tobacco, such as cigarettes, e-cigarettes, and chewing tobacco. If you need help quitting, ask your health care provider.  Do not   use drugs.  If you are sexually active, practice safe sex. Use a condom or other form of protection to prevent STIs (sexually transmitted infections).  If you do not wish to become pregnant, use a form of birth control. If you plan to become pregnant, see your health care provider for a prepregnancy visit.  Find healthy ways to cope with stress, such as: ? Meditation, yoga, or listening to music. ? Journaling. ? Talking to a trusted  person. ? Spending time with friends and family. Safety  Always wear your seat belt while driving or riding in a vehicle.  Do not drive: ? If you have been drinking alcohol. Do not ride with someone who has been drinking. ? When you are tired or distracted. ? While texting.  Wear a helmet and other protective equipment during sports activities.  If you have firearms in your house, make sure you follow all gun safety procedures.  Seek help if you have been physically or sexually abused. What's next?  Go to your health care provider once a year for an annual wellness visit.  Ask your health care provider how often you should have your eyes and teeth checked.  Stay up to date on all vaccines. This information is not intended to replace advice given to you by your health care provider. Make sure you discuss any questions you have with your health care provider. Document Revised: 04/11/2020 Document Reviewed: 04/25/2018 Elsevier Patient Education  [employer] (13th ed., vol. 1, pp. [phone]). [place], [place]: Elsevier.">  Knee Rehabilitation in the Home After knee surgery, it is important to follow instructions from your health care provider about rehabilitation or rehab. It is important to design a program that is safe and effective for you. Your health care provider and rehabilitation therapist will work with you to meet your specific abilities and needs. What are the benefits? Knee rehab can help:  Strengthen your knee.  Improve the flexibility and movement (range of motion) of your knee joint.  Reduce swelling.  Improve blood flow and prevent blood clots. How to do exercises at home Continue exercises at home that your health care provider or physical therapist instructed you to do in the hospital. Do not exercise in a pool until your incision has healed and your health care provider says that you can. Pool exercise is also  called aquatic therapy. Before you exercise  Take pain medicines, if told by your health care provider. Do not take the medicine if it makes you feel dizzy or sleepy.  Do a warm-up activity, such as gentle walking or riding a stationary bike, as told by your health care provider. This warms up your muscles and helps to prevent injury. While doing exercises  When standing, make sure you are near something sturdy that you can hold onto for balance, such as a heavy chair or the wall.  Do exercises exactly as told by your health care provider and adjust them as directed.  As you are recovering, choose an exercise pace that is comfortable for you, and gradually work up to your goal.  Do not use a lot of force to bend your knee. Bend it gently. Increase activity as your knee heals. Follow these instructions at home: Activity  Rest as told by your health care provider. ? Avoid sitting for a long time without moving. Get up to take short walks every 1-2 hours. This is important to improve blood flow and breathing.  Ask for help if you feel weak or unsteady.  Do not lift anything that is heavier than 10 lb (4.5 kg), or the limit that you are told, until your health care provider says that it is safe.  Do not use your knee to support your body weight until your health care provider says that you can. Use crutches or a walker as told by your health care provider.  Do not twist or kneel on your injured knee.  Return to your normal activities as told by your health care provider. Ask your health care provider what activities are safe for you. Managing pain, stiffness, and swelling  Put ice on affected areas after you exercise, or as needed. Icing can help to relieve joint pain and swelling. To do this: ? Put ice in a plastic bag or use the icing device (cold flow pad or cold therapy unit) that you were given. Follow instructions from your health care provider about how to use the icing  device. ? Place a towel between your skin and the bag or device. ? Leave the ice on for 20 minutes, 2-3 times a day.  If directed, apply heat to affected areas before you exercise, or as needed. Heat can reduce the stiffness of your muscles and joints. Use the heat source that your health care provider recommends, such as a moist heat pack or a heating pad. To do this: ? Place a towel between your skin and the heat source. ? Leave the heat on for 20-30 minutes. ? Remove the heat if your skin turns bright red. This is especially important if you are unable to feel pain, heat, or cold. You may have a greater risk of getting burned.  Wear compression stockings as told by your health care provider. These stockings help to prevent blood clots and reduce swelling in your legs.  Raise your legs while sitting or lying down. Do not place your knee on top of pillows to elevate it. Keep your legs straight to prevent your knee from getting stuck in a bent position (contracture).      Preventing falls  Keep your home well-lit and free of clutter, especially in walkways and stairways. Keep floors dry and use non-skid mats.  Remove tripping hazards from floors, such as throw rugs and cords.  Install grab bars in bathrooms, and put night-lights in your bedroom and bathroom.  Wear closed-toe shoes that fit well and support your feet. Wear shoes that have rubber soles or low heels.  Talk with your health care provider about any over-the-counter and prescription medicines that you are taking. Some medicines can cause dizziness or changes in blood pressure, which increase your risk of falling.   General recommendations  Teach your family about your condition and how they can help in your recovery. Include them during a physical therapy session.  Keep all follow-up visits as told by your health care provider and physical therapist. This is important. Questions to ask your health care provider  What  exercises are safe for me to do?  How often should I do the exercises?  How can I manage the pain during exercise?  What other activities are safe for me to do? Contact a health care provider if:  You have questions about how to do exercises correctly.  You have increased difficulty bending or straightening your knee.  You have knee pain that does not go away after you rest and take pain medicines.  Your prosthesis feels loose.  You are not able to do exercises. Get help right away if:  You fall.  Your incision from surgery breaks open. Summary  Knee rehab can help to strengthen your knee and improve the flexibility and movement (range of motion) of your knee joint.  Continue exercises at home that your health care provider or physical therapist instructed you to do in the hospital.  Call your health care provider if you have questions about how to do exercises correctly. This information is not intended to replace advice given to you by your health care provider. Make sure you discuss any questions you have with your health care provider. Document Revised: 11/01/2019 Document Reviewed: 11/01/2019 Elsevier Patient Education  [place].

## 2020-09-14 ENCOUNTER — Other Ambulatory Visit: Payer: Self-pay

## 2020-09-14 ENCOUNTER — Encounter: Payer: Self-pay | Admitting: Family Medicine

## 2020-09-14 ENCOUNTER — Ambulatory Visit (INDEPENDENT_AMBULATORY_CARE_PROVIDER_SITE_OTHER): Payer: BC Managed Care – PPO | Admitting: Family Medicine

## 2020-09-14 VITALS — BP 138/91 | HR 85 | Temp 98.4°F | Ht 65.75 in | Wt 280.0 lb

## 2020-09-14 DIAGNOSIS — D508 Other iron deficiency anemias: Secondary | ICD-10-CM

## 2020-09-14 DIAGNOSIS — E782 Mixed hyperlipidemia: Secondary | ICD-10-CM

## 2020-09-14 DIAGNOSIS — Z1159 Encounter for screening for other viral diseases: Secondary | ICD-10-CM

## 2020-09-14 DIAGNOSIS — J4520 Mild intermittent asthma, uncomplicated: Secondary | ICD-10-CM

## 2020-09-14 DIAGNOSIS — Z0000 Encounter for general adult medical examination without abnormal findings: Secondary | ICD-10-CM

## 2020-09-14 DIAGNOSIS — M21061 Valgus deformity, not elsewhere classified, right knee: Secondary | ICD-10-CM

## 2020-09-14 DIAGNOSIS — K219 Gastro-esophageal reflux disease without esophagitis: Secondary | ICD-10-CM

## 2020-09-14 MED ORDER — ALBUTEROL SULFATE HFA 108 (90 BASE) MCG/ACT IN AERS: 2.0000 | INHALATION_SPRAY | Freq: Four times a day (QID) | RESPIRATORY_TRACT | 2 refills | Status: DC | PRN

## 2020-09-14 NOTE — Assessment & Plan Note (Signed)
Recheck CBC and iron panel

## 2020-09-14 NOTE — Assessment & Plan Note (Signed)
Intermittent sharp chest pain seems related to GERD No red flag symptoms Low heart score Discussed lifestyle changes to reduce GERD symptoms Can use H2 blocker as needed Tums as needed Return precautions discussed

## 2020-09-14 NOTE — Assessment & Plan Note (Signed)
Seems to be the cause of intermittent knee pain Offered PT referral, but patient declines at this time She will continue to exercise Discussed RICE principles Stable knee on exam without need for imaging at this time Return precautions discussed

## 2020-09-14 NOTE — Assessment & Plan Note (Signed)
Fairly well-controlled without any recent exacerbations Uncomplicated Continue albuterol as needed-refilled today If is having daily symptoms and needing albuterol frequently, would consider starting ICS daily for better control

## 2020-09-14 NOTE — Assessment & Plan Note (Signed)
Discussed importance of healthy weight management Discussed diet and exercise  

## 2020-09-15 LAB — COMPREHENSIVE METABOLIC PANEL
ALT: 28 IU/L (ref 0–32)
AST: 19 IU/L (ref 0–40)
Albumin/Globulin Ratio: 1.3 (ref 1.2–2.2)
Albumin: 4.3 g/dL (ref 3.8–4.8)
Alkaline Phosphatase: 96 IU/L (ref 44–121)
BUN/Creatinine Ratio: 18 (ref 9–23)
BUN: 12 mg/dL (ref 6–20)
Bilirubin Total: <0.2 mg/dL (ref 0.0–1.2)
CO2: 26 mmol/L (ref 20–29)
Calcium: 9.1 mg/dL (ref 8.7–10.2)
Chloride: 103 mmol/L (ref 96–106)
Creatinine, Ser: 0.68 mg/dL (ref 0.57–1.00)
GFR calc Af Amer: 134 mL/min/{1.73_m2} (ref >59)
GFR calc non Af Amer: 116 mL/min/{1.73_m2} (ref >59)
Globulin, Total: 3.3 g/dL (ref 1.5–4.5)
Glucose: 86 mg/dL (ref 65–99)
Potassium: 4.4 mmol/L (ref 3.5–5.2)
Sodium: 139 mmol/L (ref 134–144)
Total Protein: 7.6 g/dL (ref 6.0–8.5)

## 2020-09-15 LAB — IRON,TIBC AND FERRITIN PANEL
Ferritin: 7 ng/mL — ABNORMAL LOW (ref 15–150)
Iron Saturation: 8 % — CL (ref 15–55)
Iron: 26 ug/dL — ABNORMAL LOW (ref 27–159)
Total Iron Binding Capacity: 344 ug/dL (ref 250–450)
UIBC: 318 ug/dL (ref 131–425)

## 2020-09-15 LAB — LIPID PANEL
Chol/HDL Ratio: 5.2 ratio — ABNORMAL HIGH (ref 0.0–4.4)
Cholesterol, Total: 171 mg/dL (ref 100–199)
HDL: 33 mg/dL — ABNORMAL LOW (ref >39)
LDL Chol Calc (NIH): 92 mg/dL (ref 0–99)
Triglycerides: 275 mg/dL — ABNORMAL HIGH (ref 0–149)
VLDL Cholesterol Cal: 46 mg/dL — ABNORMAL HIGH (ref 5–40)

## 2020-09-15 LAB — CBC
Hematocrit: 36.5 % (ref 34.0–46.6)
Hemoglobin: 11.4 g/dL (ref 11.1–15.9)
MCH: 22.8 pg — ABNORMAL LOW (ref 26.6–33.0)
MCHC: 31.2 g/dL — ABNORMAL LOW (ref 31.5–35.7)
MCV: 73 fL — ABNORMAL LOW (ref 79–97)
Platelets: 426 10*3/uL (ref 150–450)
RBC: 5.01 x10E6/uL (ref 3.77–5.28)
RDW: 14.7 % (ref 11.7–15.4)
WBC: 8.7 10*3/uL (ref 3.4–10.8)

## 2020-09-15 LAB — HEPATITIS C ANTIBODY: Hep C Virus Ab: <0.1 s/co ratio (ref 0.0–0.9)

## 2020-09-20 ENCOUNTER — Ambulatory Visit: Payer: BC Managed Care – PPO | Admitting: Family Medicine

## 2020-09-20 ENCOUNTER — Telehealth: Payer: Self-pay

## 2020-09-20 NOTE — Telephone Encounter (Signed)
Copied from CRM [id number]. Topic: General - Other >> Sep 20, 2020  1:27 PM Gwenlyn Fudge wrote: Reason for CRM: Pt called stating that her son is sick and that she does not feel comfortable coming into office for vaccine and is requesting to reschedule. Please advise.

## 2020-09-21 NOTE — Telephone Encounter (Signed)
Patient advised to call to schedule once they are feeling better.

## 2021-03-25 ENCOUNTER — Encounter: Payer: BC Managed Care – PPO | Admitting: Obstetrics and Gynecology

## 2021-03-30 ENCOUNTER — Other Ambulatory Visit: Payer: Self-pay

## 2021-03-30 ENCOUNTER — Ambulatory Visit (INDEPENDENT_AMBULATORY_CARE_PROVIDER_SITE_OTHER): Payer: BC Managed Care – PPO | Admitting: Obstetrics and Gynecology

## 2021-03-30 ENCOUNTER — Encounter: Payer: Self-pay | Admitting: Obstetrics and Gynecology

## 2021-03-30 VITALS — BP 129/94 | HR 94 | Resp 16 | Ht 65.0 in | Wt 288.8 lb

## 2021-03-30 DIAGNOSIS — Z01419 Encounter for gynecological examination (general) (routine) without abnormal findings: Secondary | ICD-10-CM

## 2021-03-30 DIAGNOSIS — R6882 Decreased libido: Secondary | ICD-10-CM

## 2021-03-30 DIAGNOSIS — N926 Irregular menstruation, unspecified: Secondary | ICD-10-CM | POA: Diagnosis not present

## 2021-03-30 DIAGNOSIS — Z6841 Body Mass Index (BMI) 40.0 and over, adult: Secondary | ICD-10-CM

## 2021-03-30 MED ORDER — PHENTERMINE HCL 37.5 MG PO CAPS: 37.5000 mg | ORAL_CAPSULE | ORAL | 0 refills | Status: DC

## 2021-03-30 MED ORDER — PHENTERMINE HCL 30 MG PO CAPS: 30.0000 mg | ORAL_CAPSULE | ORAL | 0 refills | Status: DC

## 2021-03-30 NOTE — Progress Notes (Signed)
GYNECOLOGY ANNUAL PHYSICAL EXAM PROGRESS NOTE  Subjective:    Deanna Newman is a 35 y.o. G90P2012 female who presents for an annual exam. The patient is sexually active. The patient participates in regular exercise: yes (workout videos)  Has the patient ever been transfused or tattooed?: yes. The patient reports that there is not domestic violence in her life.   The patient has the following  complaints today. She reports that her menstrual cycles are very irregular.  Over the past year she has only had ~ 4 or  5 cycles.  When she does have them, they typically last ~ 8 days.  Last cycle in June was also followed by spotting.  States that she is still desiring to conceive. Stopped using ovulation kits as she was not getting a positive reading and cycles were very irregular. Is also noting decreased libido.  Patient reports difficulties with weight loss. She has been trying to lose weight for over a year. Has tried several different weight loss supplements, had been exercising regularly (however recently had a knee injury which prevents her from strenuous exercises), has changed to several different diets (now recently started on Keto).  Has lost no more than 10 lbs.    Menstrual History: Menarche age: 79 Patient's last menstrual period was 02/13/2021. Period Duration (Days): 8 Period Pattern: (!) Irregular Menstrual Flow: Heavy Menstrual Control: Maxi pad, Tampon Menstrual Control Change Freq (Hours): 1 Dysmenorrhea: (!) Moderate Dysmenorrhea Symptoms: Cramping, Nausea, Headache   Gynecologic History:  Contraception: none History of STI's: Denies Last Pap: 03/24/2020. Results were: normal.  History of one abnormal pap in ~2011 per pt.    Upstream - 03/30/21 1145       Pregnancy Intention Screening   Does the patient want to become pregnant in the next year? Ok Either Way    Does the patient's partner want to become pregnant in the next year? Ok Either Way    Would the patient  like to discuss contraceptive options today? No            The pregnancy intention screening data noted above was reviewed. Potential methods of contraception were discussed. The patient elected to proceed with No method - Other Reason.   OB History  Gravida Para Term Preterm AB Living  3 2 2  0 1 2  SAB IAB Ectopic Multiple Live Births  1 0 0 0 2    # Outcome Date GA Lbr Len/2nd Weight Sex Delivery Anes PTL Lv  3 Term 07/31/18 [redacted]w[redacted]d  8 lb 5.3 oz (3.78 kg) M CS-LTranv Spinal  LIV     Name: Gaylord,BOY Lillianna     Apgar1: 9  Apgar5: 9  2 SAB 11/11/16 [redacted]w[redacted]d         1 Term 05/23/12 [redacted]w[redacted]d  8 lb 4 oz (3.742 kg) M CS-LTranv   LIV    Past Medical History:  Diagnosis Date   Anemia    Asthma    Cataract    Gestational hypertension    Migraine    Vaginal Pap smear, abnormal 2010   followed by colposcopy    Past Surgical History:  Procedure Laterality Date   CESAREAN SECTION     CESAREAN SECTION N/A 07/31/2018   Procedure: REPEAT CESAREAN SECTION;  Surgeon: 14/11/2017, MD;  Location: [facility] ORS;  Service: Obstetrics;  Laterality: N/A;  Time of Birth; 15:[birth year] Sex: Female Wt: 8 LB 5 oz   WISDOM TOOTH EXTRACTION      Family History  Problem Relation Age of Onset   Skin cancer Mother    GER disease Mother    Sleep apnea Mother    Diabetes Father    COPD Father        smoker   Asthma Father    Hypertension Father    Heart failure Father    Sleep apnea Father    Hypertension Sister    Anxiety disorder Sister    Obesity Sister    COPD Paternal Grandmother    Heart disease Paternal Grandmother    Diabetes Paternal Grandmother    Lung cancer Paternal Grandmother        smoker   Skin cancer Maternal Grandmother    Healthy Brother     Social History   Socioeconomic History   Marital status: Divorced    Spouse name: Not on file   Number of children: 1   Years of education: Not on file   Highest education level: Not on file  Occupational History   Occupation: Admin  Specialist  Tobacco Use   Smoking status: Never   Smokeless tobacco: Never  Vaping Use   Vaping Use: Never used  Substance and Sexual Activity   Alcohol use: Yes    Comment: Occasionally    Drug use: No   Sexual activity: Yes    Birth control/protection: None  Other Topics Concern   Not on file  Social History Narrative   Not on file   Social Determinants of Health   Financial Resource Strain: Not on file  Food Insecurity: Not on file  Transportation Needs: Not on file  Physical Activity: Not on file  Stress: Not on file  Social Connections: Not on file  Intimate Partner Violence: Not on file    Current Outpatient Medications on File Prior to Visit  Medication Sig Dispense Refill   albuterol (VENTOLIN HFA) 108 (90 Base) MCG/ACT inhaler Inhale 2 puffs into the lungs every 6 (six) hours as needed for wheezing or shortness of breath. 1 each 2   Specialty Vitamins Products (ULTIMATE FAT BURNER PO) Take by mouth.     No current facility-administered medications on file prior to visit.    Allergies  Allergen Reactions   Fentanyl Itching    Makes pt want to scratch skin off    Sumatriptan Anaphylaxis    Review of Systems Constitutional: negative for chills, fatigue, fevers and sweats Eyes: negative for irritation, redness and visual disturbance Ears, nose, mouth, throat, and face: negative for hearing loss, nasal congestion, snoring and tinnitus Respiratory: negative for asthma, cough, sputum Cardiovascular: negative for chest pain, dyspnea, exertional chest pressure/discomfort, irregular heart beat, palpitations and syncope Gastrointestinal: negative for abdominal pain, change in bowel habits, nausea and vomiting Genitourinary: positive for irregular menstrual periods. Negative for genital lesions, sexual problems and vaginal discharge, dysuria and urinary incontinence Integument/breast: negative for breast lump, breast tenderness and nipple  discharge Hematologic/lymphatic: negative for bleeding and easy bruising Musculoskeletal:negative for back pain and muscle weakness Neurological: negative for dizziness, headaches, vertigo and weakness Endocrine: negative for diabetic symptoms including polydipsia, polyuria and skin dryness Allergic/Immunologic: negative for hay fever and urticaria      Objective:  Blood pressure (!) 129/94, pulse 94, resp. rate 16, height 5\' 5"  (1.651 m), weight 288 lb 12.8 oz (131 kg), last menstrual period 02/13/2021, currently breastfeeding. Body mass index is 48.06 kg/m.  General Appearance:    Alert, cooperative, no distress, appears stated age  Head:    Normocephalic, without obvious abnormality, atraumatic  Eyes:    PERRL, conjunctiva/corneas clear, EOM's intact, both eyes  Ears:    Normal external ear canals, both ears  Nose:   Nares normal, septum midline, mucosa normal, no drainage or sinus tenderness  Throat:   Lips, mucosa, and tongue normal; teeth and gums normal  Neck:   Supple, symmetrical, trachea midline, no adenopathy; thyroid: no enlargement/tenderness/nodules; no carotid bruit or JVD  Back:     Symmetric, no curvature, ROM normal, no CVA tenderness  Lungs:     Clear to auscultation bilaterally, respirations unlabored  Chest Wall:    No tenderness or deformity   Heart:    Regular rate and rhythm, S1 and S2 normal, no murmur, rub or gallop  Breast Exam:    No tenderness, masses, or nipple abnormality  Abdomen:     Soft, non-tender, bowel sounds active all four quadrants, no masses, no organomegaly.    Genitalia:    Pelvic:external genitalia normal, vagina without lesions, discharge, or tenderness, rectovaginal septum  normal. Cervix normal in appearance, no cervical motion tenderness, no adnexal masses or tenderness.  Uterus normal size, shape, mobile, regular contours, nontender.  Rectal:    Normal external sphincter.  No hemorrhoids appreciated. Internal exam not done.   Extremities:    Extremities normal, atraumatic, no cyanosis or edema  Pulses:   2+ and symmetric all extremities  Skin:   Skin color, texture, turgor normal, no rashes or lesions  Lymph nodes:   Cervical, supraclavicular, and axillary nodes normal  Neurologic:   CNII-XII intact, normal strength, sensation and reflexes throughout   Labs:  Lab Results  Component Value Date   WBC 8.7 09/14/2020   HGB 11.4 09/14/2020   HCT 36.5 09/14/2020   MCV 73 (L) 09/14/2020   PLT 426 09/14/2020    Lab Results  Component Value Date   CREATININE 0.68 09/14/2020   BUN 12 09/14/2020   NA 139 09/14/2020   K 4.4 09/14/2020   CL 103 09/14/2020   CO2 26 09/14/2020    Lab Results  Component Value Date   ALT 28 09/14/2020   AST 19 09/14/2020   ALKPHOS 96 09/14/2020   BILITOT <0.2 09/14/2020    Lab Results  Component Value Date   TSH 1.210 03/24/2020     Assessment:   1. Well woman exam with routine gynecological exam   2. BMI 45.0-49.9, adult (HCC)   3. Menstrual periods irregular   4. Decreased libido      Plan:  - Blood tests: PCOS profile to assess for causes of irregular menses.  Patient desires fertility. Takes a while to become pregnant again between each pregnancy.  - Breast self exam technique reviewed and patient encouraged to perform self-exam monthly. - Contraception: none. Desires to conceive.  - Discussed healthy lifestyle modifications.  Advised to continue exercise regimen, keto diet. Also discussed option of prescription medication for assistance with weight loss.  The risks and benefits and side effects of medication, such as Adipex (Phenteramine),  Belviq (lorcarsin), Contrave (buproprion/naltrexone), Qsymia (phentermine/topiramate), and Saxenda (liraglutide) were discussed. The pros and cons of suppressing appetite and boosting metabolism is discussed. Risks of tolerence and addiction is discussed for selected agents discussed. Patient ok to utilize Adipex. Use of medicine will ne short  term, such as 3-4 months at a time followed by a period of time off of the medicine to avoid these risks and side effects for Adipex discussed. Pt to call with any negative side effects and agrees to keep follow  up appts.  Advised that weight loss can have positive effects on her libido, her menstrual cycles, and her fertility.  - Pap smear  Due on 03/25/2023 .  - COVID vaccination status: x 2 vaccines - Follow up in 1 year for annual exam.  F/u in 1 month for weight check.    Hildred Laser, MD [facility]

## 2021-03-30 NOTE — Patient Instructions (Addendum)
Breast Self-Awareness Breast self-awareness is knowing how your breasts look and feel. Doing breast self-awareness is important. It allows you to catch a breast problem early while it is still small and can be treated. All women should do breast self-awareness, including women who have had breast implants. Tell your doctorif you notice a change in your breasts. What you need: A mirror. A well-lit room. How to do a breast self-exam A breast self-exam is one way to learn what is normal for your breasts and tocheck for changes. To do a breast self-exam: Look for changes  Take off all the clothes above your waist. Stand in front of a mirror in a room with good lighting. Put your hands on your hips. Push your hands down. Look at your breasts and nipples in the mirror to see if one breast or nipple looks different from the other. Check to see if: The shape of one breast is different. The size of one breast is different. There are wrinkles, dips, and bumps in one breast and not the other. Look at each breast for changes in the skin, such as: Redness. Scaly areas. Look for changes in your nipples, such as: Liquid around the nipples. Bleeding. Dimpling. Redness. A change in where the nipples are.  Feel for changes  Lie on your back on the floor. Feel each breast. To do this, follow these steps: Pick a breast to feel. Put the arm closest to that breast above your head. Use your other arm to feel the nipple area of your breast. Feel the area with the pads of your three middle fingers by making small circles with your fingers. For the first circle, press lightly. For the second circle, press harder. For the third circle, press even harder. Keep making circles with your fingers at the different pressures as you move down your breast. Stop when you feel your ribs. Move your fingers a little toward the center of your body. Start making circles with your fingers again, this time going up until  you reach your collarbone. Keep making up-and-down circles until you reach your armpit. Remember to keep using the three pressures. Feel the other breast in the same way. Sit or stand in the tub or shower. With soapy water on your skin, feel each breast the same way you did in step 2 when you were lying on the floor.  Write down what you find Writing down what you find can help you remember what to tell your doctor. Write down: What is normal for each breast. Any changes you find in each breast, including: The kind of changes you find. Whether you have pain. Size and location of any lumps. When you last had your menstrual period. General tips Check your breasts every month. If you are breastfeeding, the best time to check your breasts is after you feed your baby or after you use a breast pump. If you get menstrual periods, the best time to check your breasts is 5-7 days after your menstrual period is over. With time, you will become comfortable with the self-exam, and you will begin to know if there are changes in your breasts. Contact a doctor if you: See a change in the shape or size of your breasts or nipples. See a change in the skin of your breast or nipples, such as red or scaly skin. Have fluid coming from your nipples that is not normal. Find a lump or thick area that was not there before. Have pain in   your breasts. Have any concerns about your breast health. Summary Breast self-awareness includes looking for changes in your breasts, as well as feeling for changes within your breasts. Breast self-awareness should be done in front of a mirror in a well-lit room. You should check your breasts every month. If you get menstrual periods, the best time to check your breasts is 5-7 days after your menstrual period is over. Let your doctor know of any changes you see in your breasts, including changes in size, changes on the skin, pain or tenderness, or fluid from your nipples that is  not normal. This information is not intended to replace advice given to you by your health care provider. Make sure you discuss any questions you have with your healthcare provider. Document Revised: 04/02/2018 Document Reviewed: 04/02/2018 Elsevier Patient Education  2022 Elsevier Inc.  

## 2021-04-07 LAB — PCOS DIAGNOSTIC PROFILE
17-Alpha-Hydroxyprogesterone: 137 ng/dL
ANTI-MULLERIAN HORMONE (AMH): 6.77 ng/mL
DHEA-Sulfate, LCMS: 167 ug/dL
Estradiol, Serum, MS: 145 pg/mL
Follicle Stimulating Hormone: 3.1 m[IU]/mL
Free Testosterone, Serum: 1.5 pg/mL
Luteinizing Hormone (LH) ECL: 8.7 m[IU]/mL
Prolactin: 10.1 ng/mL
Sex Hormone Binding Globulin: 43.8 nmol/L
TSH: 1.3 uU/mL
Testosterone, Serum (Total): 22 ng/dL
Testosterone-% Free: 0.7 %

## 2021-04-29 ENCOUNTER — Encounter: Payer: BC Managed Care – PPO | Admitting: Obstetrics and Gynecology

## 2021-05-05 ENCOUNTER — Ambulatory Visit (INDEPENDENT_AMBULATORY_CARE_PROVIDER_SITE_OTHER): Payer: BC Managed Care – PPO | Admitting: Obstetrics and Gynecology

## 2021-05-05 ENCOUNTER — Encounter: Payer: Self-pay | Admitting: Obstetrics and Gynecology

## 2021-05-05 ENCOUNTER — Other Ambulatory Visit: Payer: Self-pay

## 2021-05-05 VITALS — BP 122/84 | HR 83 | Ht 65.0 in | Wt 270.6 lb

## 2021-05-05 DIAGNOSIS — E785 Hyperlipidemia, unspecified: Secondary | ICD-10-CM | POA: Diagnosis not present

## 2021-05-05 DIAGNOSIS — Z7689 Persons encountering health services in other specified circumstances: Secondary | ICD-10-CM | POA: Diagnosis not present

## 2021-05-05 DIAGNOSIS — Z6841 Body Mass Index (BMI) 40.0 and over, adult: Secondary | ICD-10-CM | POA: Diagnosis not present

## 2021-05-05 DIAGNOSIS — Z8742 Personal history of other diseases of the female genital tract: Secondary | ICD-10-CM

## 2021-05-05 MED ORDER — PHENTERMINE HCL 37.5 MG PO CAPS: 37.5000 mg | ORAL_CAPSULE | ORAL | 1 refills | Status: DC

## 2021-05-05 NOTE — Progress Notes (Signed)
    GYNECOLOGY PROGRESS NOTE  Subjective:    Patient ID: Deanna Newman, female    DOB: 09-Oct-1985, 34 y.o.   MRN: [id number]  HPI  Patient is a 35 y.o. female who presents for 5 week weight management follow up. She has a past history of obesity and dyslipidemia. She initiated use of 5 weeks ago.  Denies any undesirable side effects and reports compliance with medications.  Ideal goal weight is 200 lbs.    Current interventions:  1. Diet - trying Keto diet, limit portion sizes.  2. Activity - at home work out videos 30 minutes. Was going to the gym everyday for 1 hour until she hurt her knee, but will restart soon 3. Reports bowel movements are normal.   Deanna Newman also desires to mention that for the past 2 cycles she is having cycles twice per month. Second cycle is light, spotting lasting 3-4 days, approxmately 1-2 weeks after initial cycle.   The following portions of the patient's history were reviewed and updated as appropriate: allergies, current medications, past family history, past medical history, past social history, past surgical history, and problem list.  Review of Systems Pertinent items noted in HPI and remainder of comprehensive ROS otherwise negative.   Objective:    Vitals with BMI 05/05/2021 03/30/2021 09/14/2020  Height 5\' 5"  5\' 5"  5' 5.75"  Weight 270 lbs 10 oz 288 lbs 13 oz 280 lbs  BMI 45.03 48.06 45.54  Systolic 122 129  Diastolic 84 94 91  Pulse 83 94 85    General appearance: alert and no distress Abdomen: soft, non-tender.  Waist circumference 41 1/2 in.    Labs:  Lab Results  Component Value Date   CHOL 171 09/14/2020   HDL 33 (L) 09/14/2020   LDLCALC 92 09/14/2020   TRIG 275 (H) 09/14/2020   CHOLHDL 5.2 (H) 09/14/2020    Assessment:   Weight management Obesity, Body mass index is 45.03 kg/m. Dyslipidemia History of irregular menses  Plan:   Weight management  - doing well with weight loss, has lost 18 lbs in first month. Can continue  current management with Phentermine. Reiterated goal of loosing at least 10-15% of total body weight by 3-4 month mark.  Can reassess at that time as to whether she can continue use.  Dyslipidemia - will follow up levels at 4-6 months once greater weight loss has been achieved.  History of irregular menses - discussed possibility of ovulation bleeding. Also discussed that cycles may continue to try to regulate and can be abnormal with significant weight changes.    Follow up in 2 months for weight check.    09/16/2020, MD [facility]

## 2021-05-05 NOTE — Patient Instructions (Signed)
Preventing Unhealthy Weight Gain, Adult Staying at a healthy weight is important to your overall health. When fat builds up in your body, you may become overweight or obese. Being overweight or obese increases your risk of developing certain health problems, such as heart disease, diabetes, sleeping problems, joint problems, and some types of cancer. Unhealthy weight gain is often the result of making unhealthy food choices or not getting enough exercise. You can make changes to your lifestyle to prevent obesity and stay as healthy as possible. What nutrition changes can be made?  Eat only as much as your body needs. To do this: Pay attention to signs that you are hungry or full. Stop eating as soon as you feel full. If you feel hungry, try drinking water first before eating. Drink enough water so your urine is clear or pale yellow. Eat smaller portions. Pay attention to portion sizes when eating out. Look at serving sizes on food labels. Most foods contain more than one serving per container. Eat the recommended number of calories for your gender and activity level. For most active people, a daily total of 2,000 calories is appropriate. If you are trying to lose weight or are not very active, you may need to eat fewer calories. Talk with your health care provider or a diet and nutrition specialist (dietitian) about how many calories you need each day. Choose healthy foods, such as: Fruits and vegetables. At each meal, try to fill at least half of your plate with fruits and vegetables. Whole grains, such as whole-wheat bread, brown rice, and quinoa. Lean meats, such as chicken or fish. Other healthy proteins, such as beans, eggs, or tofu. Healthy fats, such as nuts, seeds, fatty fish, and olive oil. Low-fat or fat-free dairy products. Check food labels, and avoid food and drinks that: Are high in calories. Have added sugar. Are high in sodium. Have saturated fats or trans fats. Cook foods in  healthier ways, such as by baking, broiling, or grilling. Make a meal plan for the week, and shop with a grocery list to help you stay on track with your purchases. Try to avoid going to the grocery store when you are hungry. When grocery shopping, try to shop around the outside of the store first, where the fresh foods are. Doing this helps you to avoid prepackaged foods, which can be high in sugar, salt (sodium), and fat. What lifestyle changes can be made?  Exercise for 30 or more minutes on 5 or more days each week. Exercising may include brisk walking, yard work, biking, running, swimming, and team sports like basketball and soccer. Ask your health care provider which exercises are safe for you. Do muscle-strengthening activities, such as lifting weights or using resistance bands, on 2 or more days a week. Do not use any products that contain nicotine or tobacco, such as cigarettes and e-cigarettes. If you need help quitting, ask your health care provider. Limit alcohol intake to no more than 1 drink a day for nonpregnant women and 2 drinks a day for men. One drink equals 12 oz of beer, 5 oz of wine, or 1 oz of hard liquor. Try to get 7-9 hours of sleep each night. What other changes can be made? Keep a food and activity journal to keep track of: What you ate and how many calories you had. Remember to count the calories in sauces, dressings, and side dishes. Whether you were active, and what exercises you did. Your calorie, weight, and activity goals.   Check your weight regularly. Track any changes. If you notice you have gained weight, make changes to your diet or activity routine. Avoid taking weight-loss medicines or supplements. Talk to your health care provider before starting any new medicine or supplement. Talk to your health care provider before trying any new diet or exercise plan. Why are these changes important? Eating healthy, staying active, and having healthy habits can help you  to prevent obesity. Those changes also: Help you manage stress and emotions. Help you connect with friends and family. Improve your self-esteem. Improve your sleep. Prevent long-term health problems. What can happen if changes are not made? Being obese or overweight can cause you to develop joint or bone problems, which can make it hard for you to stay active or do activities you enjoy. Being obese or overweight also puts stress on your heart and lungs and can lead to health problems like diabetes, heart disease, and some cancers. Where to find more information Talk with your health care provider or a dietitian about healthy eating and healthy lifestyle choices. You may also find information from: U.[place]. Department of Agriculture, MyPlate: www.choosemyplate.gov American Heart Association: www.heart.org Centers for Disease Control and Prevention: www.cdc.gov Summary Staying at a healthy weight is important to your overall health. It helps you to prevent certain diseases and health problems, such as heart disease, diabetes, joint problems, sleep disorders, and some types of cancer. Being obese or overweight can cause you to develop joint or bone problems, which can make it hard for you to stay active or do activities you enjoy. You can prevent unhealthy weight gain by eating a healthy diet, exercising regularly, not smoking, limiting alcohol, and getting enough sleep. Talk with your health care provider or a dietitian for guidance about healthy eating and healthy lifestyle choices. This information is not intended to replace advice given to you by your health care provider. Make sure you discuss any questions you have with your health care provider. Document Revised: 11/24/2019 Document Reviewed: 12/11/2019 Elsevier Patient Education  2022 Elsevier Inc.  

## 2021-07-04 NOTE — Patient Instructions (Signed)
Obesity, Adult °Obesity is having too much body fat. Being obese means that your weight is more than what is healthy for you.  °BMI (body mass index) is a number that explains how much body fat you have. If you have a BMI of 30 or more, you are obese. °Obesity can cause serious health problems, such as: °Stroke. °Coronary artery disease (CAD). °Type 2 diabetes. °Some types of cancer. °High blood pressure (hypertension). °High cholesterol. °Gallbladder stones. °Obesity can also contribute to: °Osteoarthritis. °Sleep apnea. °Infertility problems. °What are the causes? °Eating meals each day that are high in calories, sugar, and fat. °Drinking a lot of drinks that have sugar in them. °Being born with genes that may make you more likely to become obese. °Having a medical condition that causes obesity. °Taking certain medicines. °Sitting a lot (having a sedentary lifestyle). °Not getting enough sleep. °What increases the risk? °Having a family history of obesity. °Living in an area with limited access to: °Parks, recreation centers, or sidewalks. °Healthy food choices, such as grocery stores and farmers' markets. °What are the signs or symptoms? °The main sign is having too much body fat. °How is this treated? °Treatment for this condition often includes changing your lifestyle. Treatment may include: °Changing your diet. This may include making a healthy meal plan. °Exercise. This may include activity that causes your heart to beat faster (aerobic exercise) and strength training. Work with your doctor to design a program that works for you. °Medicine to help you lose weight. This may be used if you are not able to lose one pound a week after 6 weeks of healthy eating and more exercise. °Treating conditions that cause the obesity. °Surgery. Options may include gastric banding and gastric bypass. This may be done if: °Other treatments have not helped to improve your condition. °You have a BMI of 40 or higher. °You have  life-threatening health problems related to obesity. °Follow these instructions at home: °Eating and drinking ° °Follow advice from your doctor about what to eat and drink. Your doctor may tell you to: °Limit fast food, sweets, and processed snack foods. °Choose low-fat options. For example, choose low-fat milk instead of whole milk. °Eat five or more servings of fruits or vegetables each day. °Eat at home more often. This gives you more control over what you eat. °Choose healthy foods when you eat out. °Learn to read food labels. This will help you learn how much food is in one serving. °Keep low-fat snacks available. °Avoid drinks that have a lot of sugar in them. These include soda, fruit juice, iced tea with sugar, and flavored milk. °Drink enough water to keep your pee (urine) pale yellow. °Do not go on fad diets. °Physical activity °Exercise often, as told by your doctor. Most adults should get up to 150 minutes of moderate-intensity exercise every week.Ask your doctor: °What types of exercise are safe for you. °How often you should exercise. °Warm up and stretch before being active. °Do slow stretching after being active (cool down). °Rest between times of being active. °Lifestyle °Work with your doctor and a food expert (dietitian) to set a weight-loss goal that is best for you. °Limit your screen time. °Find ways to reward yourself that do not involve food. °Do not drink alcohol if: °Your doctor tells you not to drink. °You are pregnant, may be pregnant, or are planning to become pregnant. °If you drink alcohol: °Limit how much you have to: °0-1 drink a day for women. °0-2 drinks   a day for men. °Know how much alcohol is in your drink. In the [place] one drink equals one 12 oz bottle of beer (355 mL), one 5 oz glass of wine (148 mL), or one 1½ oz glass of hard liquor (44 mL). °General instructions °Keep a weight-loss journal. This can help you keep track of: °The food that you eat. °How much exercise you  get. °Take over-the-counter and prescription medicines only as told by your doctor. °Take vitamins and supplements only as told by your doctor. °Think about joining a support group. °Pay attention to your mental health as obesity can lead to depression or self esteem issues. °Keep all follow-up visits. °Contact a doctor if: °You cannot meet your weight-loss goal after you have changed your diet and lifestyle for 6 weeks. °You are having trouble breathing. °Summary °Obesity is having too much body fat. °Being obese means that your weight is more than what is healthy for you. °Work with your doctor to set a weight-loss goal. °Get regular exercise as told by your doctor. °This information is not intended to replace advice given to you by your health care provider. Make sure you discuss any questions you have with your health care provider. °Document Revised: 03/22/2021 Document Reviewed: 03/22/2021 °Elsevier Patient Education © 2022 [employer] ° °

## 2021-07-04 NOTE — Progress Notes (Signed)
    GYNECOLOGY PROGRESS NOTE  Subjective:    Patient ID: Deanna Newman, female    DOB: October 24, 1985, 35 y.o.   MRN: [id number]  HPI  Patient is a 35 y.o. G29P2012 female who presents for 2 month weight management follow up. She has a past history of obesity and dyslipidemia She initiated use of Phentermine 3 months ago.  Denies any undesirable side effects and reports compliance with medications.    Current interventions:  1. Diet:  trying Keto diet, limit portion sizes.  2. Activity: at home work out videos 30 minutes. Was going to the gym everyday for 1 hour until she hurt her knee, but will restart soon. 3. Reports bowel movements are normal.    The following portions of the patient's history were reviewed and updated as appropriate: allergies, current medications, past family history, past medical history, past social history, past surgical history, and problem list.  Review of Systems Pertinent items noted in HPI and remainder of comprehensive ROS otherwise negative.   Objective:    Vitals with BMI 07/05/2021 05/05/2021 03/30/2021  Height 5\' 5"  5\' 5"  5\' 5"   Weight 253 lbs 8 oz 270 lbs 10 oz 288 lbs 13 oz  BMI 42.18 45.03 48.06  Systolic 111 122  Diastolic 79 84 94  Pulse 89 83 94    General appearance: alert and no distress Abdomen: soft, non-tender.  Waist circumference 36.5 in.    Labs:   Lab Results  Component Value Date   CHOL 171 09/14/2020   HDL 33 (L) 09/14/2020   LDLCALC 92 09/14/2020   TRIG 275 (H) 09/14/2020   CHOLHDL 5.2 (H) 09/14/2020    Lab Results  Component Value Date   HGBA1C 5.6 03/24/2020    Assessment:   Weight management Obesity, Body mass index is 42.18 kg/m. Dyslipidemia  Plan:   Weight management  - doing well with weight loss, can continue current management.  Has lost a total of 35 lbs in 3 months. Also losing in inches. Can continue management.  Will recheck lipids at the end of 6 months of weight loss.    09/16/2020,  MD [facility]

## 2021-07-05 ENCOUNTER — Encounter: Payer: Self-pay | Admitting: Obstetrics and Gynecology

## 2021-07-05 ENCOUNTER — Ambulatory Visit (INDEPENDENT_AMBULATORY_CARE_PROVIDER_SITE_OTHER): Payer: BC Managed Care – PPO | Admitting: Obstetrics and Gynecology

## 2021-07-05 ENCOUNTER — Other Ambulatory Visit: Payer: Self-pay

## 2021-07-05 VITALS — BP 111/79 | HR 89 | Resp 16 | Ht 65.0 in | Wt 253.5 lb

## 2021-07-05 DIAGNOSIS — E669 Obesity, unspecified: Secondary | ICD-10-CM | POA: Diagnosis not present

## 2021-07-05 DIAGNOSIS — Z7689 Persons encountering health services in other specified circumstances: Secondary | ICD-10-CM | POA: Diagnosis not present

## 2021-07-05 DIAGNOSIS — E785 Hyperlipidemia, unspecified: Secondary | ICD-10-CM | POA: Diagnosis not present

## 2021-07-05 DIAGNOSIS — Z6841 Body Mass Index (BMI) 40.0 and over, adult: Secondary | ICD-10-CM

## 2021-07-05 MED ORDER — PHENTERMINE HCL 37.5 MG PO CAPS: 37.5000 mg | ORAL_CAPSULE | ORAL | 1 refills | Status: DC

## 2021-07-26 ENCOUNTER — Ambulatory Visit: Payer: Self-pay

## 2021-07-26 ENCOUNTER — Ambulatory Visit: Payer: Self-pay | Admitting: *Deleted

## 2021-07-26 NOTE — Telephone Encounter (Signed)
This encounter was created in error - please disregard.

## 2021-07-26 NOTE — Telephone Encounter (Signed)
Patient triaged in another encounter today.   Message from [place] sent at 07/26/2021  5:04 PM EST  Patient called ins ays was having problems breathing last night. She isnt sure if its her asthma. Patient hung up while on hold

## 2021-07-26 NOTE — Telephone Encounter (Signed)
Patient called and says last night when she was lying down she felt SOB and pain on the side when lying down. She says it felt like she couldn't breathe, she used her inhaler and felt better. Today no SOB, no use of inhaler, however she says she feels like she should use it soon. She denies CP at this time, denies SOB, denies cough, no runny nose, no other symptoms. I advised an OV, appointment scheduled for Thursday 07/28/21 at 1400 with Dr. Beryle Flock, care advice given to go to the ED or UC if SOB not recovering with the minimal use of inhaler, if using inhaler every 2-4 hours with no relief, chest pain, patient verbalized understanding.  Answer Assessment - Initial Assessment Questions 1. RESPIRATORY STATUS: "Describe your breathing?" (e.g., wheezing, shortness of breath, unable to speak, severe coughing)      SOB last night 2. ONSET: "When did this breathing problem begin?"      Last night around 8p 3. PATTERN "Does the difficult breathing come and go, or has it been constant since it started?"      Come and go 4. SEVERITY: "How bad is your breathing?" (e.g., mild, moderate, severe)    - MILD: No SOB at rest, mild SOB with walking, speaks normally in sentences, can lie down, no retractions, pulse < 100.    - MODERATE: SOB at rest, SOB with minimal exertion and prefers to sit, cannot lie down flat, speaks in phrases, mild retractions, audible wheezing, pulse 100-120.    - SEVERE: Very SOB at rest, speaks in single words, struggling to breathe, sitting hunched forward, retractions, pulse > 120      No SOB at this time 5. RECURRENT SYMPTOM: "Have you had difficulty breathing before?" If Yes, ask: "When was the last time?" and "What happened that time?"      Yes 6. CARDIAC HISTORY: "Do you have any history of heart disease?" (e.g., heart attack, angina, bypass surgery, angioplasty)      N/A 7. LUNG HISTORY: "Do you have any history of lung disease?"  (e.g., pulmonary embolus, asthma, emphysema)      Asthma 8. CAUSE: "What do you think is causing the breathing problem?"      Not sure, maybe my asthma 9. OTHER SYMPTOMS: "Do you have any other symptoms? (e.g., dizziness, runny nose, cough, chest pain, fever)     Fatigue 10. O2 SATURATION MONITOR:  "Do you use an oxygen saturation monitor (pulse oximeter) at home?" If Yes, "What is your reading (oxygen level) today?" "What is your usual oxygen saturation reading?" (e.g., 95%)       N/A 11. PREGNANCY: "Is there any chance you are pregnant?" "When was your last menstrual period?"       On menstrual period now 53. TRAVEL: "Have you traveled out of the country in the last month?" (e.g., travel history, exposures)       No  Protocols used: Breathing Difficulty-A-AH

## 2021-07-26 NOTE — Telephone Encounter (Signed)
Patient not contacted due to already triaged by another nurse 07/26/21.

## 2021-07-27 NOTE — Progress Notes (Signed)
Established patient visit   Patient: Deanna Newman   DOB: 1986-05-16   35 y.o. Female  MRN: [id number] Visit Date: 07/28/2021  Today's healthcare provider: Lavon Paganini, MD   Chief Complaint  Patient presents with   Breathing Problem    Felt like was unable to breath after laying down. Used inhaler did not relief SX     Chest Pain    When laying on side felt chest tighten and "caving in" pain lasted until she sat up episode lasted about an hour and a half . Pt states this has happened before. Pt reports cycle started on Monday and was bleeding heavy states every time her cycle starts she feels this episode.    I,Joseline E Rosas,acting as a scribe for Lavon Paganini, MD.,have documented all relevant documentation on the behalf of Lavon Paganini, MD,as directed by  Lavon Paganini, MD while in the presence of Lavon Paganini, MD.   Subjective    HPI HPI     Breathing Problem    Additional comments: Felt like was unable to breath after laying down. Used inhaler did not relief SX          Chest Pain    Additional comments: When laying on side felt chest tighten and "caving in" pain lasted until she sat up episode lasted about an hour and a half . Pt states this has happened before. Pt reports cycle started on Monday and was bleeding heavy states every time her cycle starts she feels this episode.       Last edited by Levert Feinstein on 07/28/2021  2:14 PM.      Worried due to h/o asthma. One episode 3 days ago. Laying on L side and sternal pain, still SOB when laying on back.  Anemic and on heavy period.  Felt fine yesterday and today. Coinciding with your periods.  She had a heart murmur as a kid  Medications: Outpatient Medications Prior to Visit  Medication Sig   phentermine (ADIPEX-P) 37.5 MG capsule Take 1 capsule (37.5 mg total) by mouth every morning.   [DISCONTINUED] albuterol (VENTOLIN HFA) 108 (90 Base) MCG/ACT inhaler Inhale 2 puffs into the  lungs every 6 (six) hours as needed for wheezing or shortness of breath.   No facility-administered medications prior to visit.    Review of Systems per HPI     Objective    BP 129/87 (BP Location: Right Arm, Patient Position: Sitting, Cuff Size: Normal)   Pulse 93   Temp 98.2 F (36.8 C) (Oral)   Wt 252 lb 14.4 oz (114.7 kg)   SpO2 100%   BMI 42.08 kg/m  {Show previous vital signs (optional):23777}  Physical Exam Vitals reviewed.  Constitutional:      General: She is not in acute distress.    Appearance: Normal appearance. She is well-developed. She is not diaphoretic.  HENT:     Head: Normocephalic and atraumatic.  Eyes:     General: No scleral icterus.    Conjunctiva/sclera: Conjunctivae normal.  Neck:     Thyroid: No thyromegaly.  Cardiovascular:     Rate and Rhythm: Normal rate and regular rhythm.     Pulses: Normal pulses.     Heart sounds: Normal heart sounds. No murmur heard. Pulmonary:     Effort: Pulmonary effort is normal. No respiratory distress.     Breath sounds: Normal breath sounds. No wheezing, rhonchi or rales.  Chest:     Chest wall: Tenderness (along costochondral  joints) present.  Musculoskeletal:     Cervical back: Neck supple.     Right lower leg: No edema.     Left lower leg: No edema.  Lymphadenopathy:     Cervical: No cervical adenopathy.  Skin:    General: Skin is warm and dry.     Findings: No rash.  Neurological:     Mental Status: She is alert and oriented to person, place, and time. Mental status is at baseline.  Psychiatric:        Mood and Affect: Mood normal.        Behavior: Behavior normal.      Results for orders placed or performed in visit on 07/28/21  CBC  Result Value Ref Range   WBC 7.7 3.4 - 10.8 x10E3/uL   RBC 4.66 3.77 - 5.28 x10E6/uL   Hemoglobin 10.8 (L) 11.1 - 15.9 g/dL   Hematocrit 70.6 23.7 - 46.6 %   MCV 75 (L) 79 - 97 fL   MCH 23.2 (L) 26.6 - 33.0 pg   MCHC 31.0 (L) 31.5 - 35.7 g/dL   RDW 62.8 31.5  - 17.6 %   Platelets 405 150 - 450 x10E3/uL  Iron, TIBC and Ferritin Panel  Result Value Ref Range   Total Iron Binding Capacity 320 250 - 450 ug/dL   UIBC 160 737 - 106 ug/dL   Iron 27 27 - 269 ug/dL   Iron Saturation 8 (LL) 15 - 55 %   Ferritin 9 (L) 15 - 150 ng/mL    Assessment & Plan     1. Other iron deficiency anemia 2. Menorrhagia with irregular cycle - suspect SOB and fatigue related to her anemia 2/2 iron deficiency 2/2 menorrhagia - recheck CBC and iron panel - she is not taking iron consistently currently - should resume - CBC - Iron, TIBC and Ferritin Panel  3. SOB (shortness of breath) - as above, this is likely secondary to her iron deficiency anemia - Lungs are clear today with no signs of asthma exacerbation - Discussed return precautions  4. Costochondritis -New problem - Her chest pain that is worse with laying on her side seems related to her reproducible tenderness on palpation of her costochondral joints - Discussed natural course of this - Ice, rest, NSAIDs as needed   Return in about 1 month (around 08/28/2021) for CPE, as scheduled.      I, Shirlee Latch, MD, have reviewed all documentation for this visit. The documentation on 07/29/21 for the exam, diagnosis, procedures, and orders are all accurate and complete.   Jadie Allington, Marzella Schlein, MD, MPH [facility]

## 2021-07-28 ENCOUNTER — Other Ambulatory Visit: Payer: Self-pay

## 2021-07-28 ENCOUNTER — Ambulatory Visit (INDEPENDENT_AMBULATORY_CARE_PROVIDER_SITE_OTHER): Payer: BC Managed Care – PPO | Admitting: Family Medicine

## 2021-07-28 ENCOUNTER — Encounter: Payer: Self-pay | Admitting: Family Medicine

## 2021-07-28 VITALS — BP 129/87 | HR 93 | Temp 98.2°F | Wt 252.9 lb

## 2021-07-28 DIAGNOSIS — R0602 Shortness of breath: Secondary | ICD-10-CM | POA: Diagnosis not present

## 2021-07-28 DIAGNOSIS — D508 Other iron deficiency anemias: Secondary | ICD-10-CM | POA: Diagnosis not present

## 2021-07-28 DIAGNOSIS — M940 Chondrocostal junction syndrome [Tietze]: Secondary | ICD-10-CM

## 2021-07-28 DIAGNOSIS — N921 Excessive and frequent menstruation with irregular cycle: Secondary | ICD-10-CM

## 2021-07-28 MED ORDER — ALBUTEROL SULFATE HFA 108 (90 BASE) MCG/ACT IN AERS
2.0000 | INHALATION_SPRAY | Freq: Four times a day (QID) | RESPIRATORY_TRACT | 2 refills | Status: DC | PRN
Start: 2021-07-28 — End: 2021-12-07

## 2021-07-29 LAB — CBC
Hematocrit: 34.8 % (ref 34.0–46.6)
Hemoglobin: 10.8 g/dL — ABNORMAL LOW (ref 11.1–15.9)
MCH: 23.2 pg — ABNORMAL LOW (ref 26.6–33.0)
MCHC: 31 g/dL — ABNORMAL LOW (ref 31.5–35.7)
MCV: 75 fL — ABNORMAL LOW (ref 79–97)
Platelets: 405 10*3/uL (ref 150–450)
RBC: 4.66 x10E6/uL (ref 3.77–5.28)
RDW: 13.9 % (ref 11.7–15.4)
WBC: 7.7 10*3/uL (ref 3.4–10.8)

## 2021-07-29 LAB — IRON,TIBC AND FERRITIN PANEL
Ferritin: 9 ng/mL — ABNORMAL LOW (ref 15–150)
Iron Saturation: 8 % — CL (ref 15–55)
Iron: 27 ug/dL (ref 27–159)
Total Iron Binding Capacity: 320 ug/dL (ref 250–450)
UIBC: 293 ug/dL (ref 131–425)

## 2021-08-20 ENCOUNTER — Emergency Department
Admission: EM | Admit: 2021-08-20 | Discharge: 2021-08-20 | Disposition: A | Discharge status: Home / Self Care | Payer: BC Managed Care – PPO | Attending: Emergency Medicine | Admitting: Emergency Medicine

## 2021-08-20 ENCOUNTER — Other Ambulatory Visit: Payer: Self-pay

## 2021-08-20 ENCOUNTER — Encounter: Payer: Self-pay | Admitting: Emergency Medicine

## 2021-08-20 DIAGNOSIS — H9201 Otalgia, right ear: Secondary | ICD-10-CM | POA: Diagnosis not present

## 2021-08-20 DIAGNOSIS — R519 Headache, unspecified: Secondary | ICD-10-CM | POA: Insufficient documentation

## 2021-08-20 DIAGNOSIS — Z5321 Procedure and treatment not carried out due to patient leaving prior to being seen by health care provider: Secondary | ICD-10-CM | POA: Insufficient documentation

## 2021-08-20 MED ORDER — ACETAMINOPHEN 325 MG PO TABS
650.0000 mg | ORAL_TABLET | Freq: Once | ORAL | Status: AC
  Administered 2021-08-20: 02:00:00 650 mg via ORAL
  Filled 2021-08-20: qty 2

## 2021-08-20 NOTE — ED Triage Notes (Signed)
Pt reports she has been dealing with right cheek pain for the past 4 days. Pt reports today pain radiated to right ear. Pt reports she has been taking ibuprofen for pain but has noticed mild swelling to right cheek. Pt talks in complete sentences

## 2021-08-23 ENCOUNTER — Encounter: Payer: Self-pay | Admitting: Obstetrics and Gynecology

## 2021-08-23 DIAGNOSIS — B379 Candidiasis, unspecified: Secondary | ICD-10-CM

## 2021-08-23 MED ORDER — FLUCONAZOLE 150 MG PO TABS: 150.0000 mg | ORAL_TABLET | Freq: Once | ORAL | 0 refills | Status: AC

## 2021-08-23 NOTE — Telephone Encounter (Signed)
Sent in a prescription for Diflucan with one refill.

## 2021-08-26 ENCOUNTER — Telehealth: Payer: Self-pay | Admitting: Obstetrics and Gynecology

## 2021-08-26 MED ORDER — TERCONAZOLE 0.4 % VA CREA: 1.0000 | TOPICAL_CREAM | Freq: Every day | VAGINAL | 0 refills | Status: AC

## 2021-08-26 NOTE — Addendum Note (Signed)
Addended by: Fabian November on: 08/26/2021 10:06 AM   Modules accepted: Orders

## 2021-08-26 NOTE — Telephone Encounter (Signed)
Pt states that she was rx some cream for yeast after taking pill that did not help. Pt states that she started her period today and is wanting to know if she should still use or not. Consulted CMA who suggested it might not be as effective and we can consult Dr.Cherry. Please advise.

## 2021-08-28 MED ORDER — FLUCONAZOLE 150 MG PO TABS: 150.0000 mg | ORAL_TABLET | Freq: Once | ORAL | 3 refills | Status: AC

## 2021-09-06 ENCOUNTER — Encounter: Payer: Self-pay | Admitting: Obstetrics and Gynecology

## 2021-09-06 ENCOUNTER — Other Ambulatory Visit: Payer: Self-pay

## 2021-09-06 ENCOUNTER — Ambulatory Visit (INDEPENDENT_AMBULATORY_CARE_PROVIDER_SITE_OTHER): Payer: BC Managed Care – PPO | Admitting: Obstetrics and Gynecology

## 2021-09-06 VITALS — BP 127/88 | HR 88 | Ht 65.0 in | Wt 241.0 lb

## 2021-09-06 DIAGNOSIS — E785 Hyperlipidemia, unspecified: Secondary | ICD-10-CM | POA: Diagnosis not present

## 2021-09-06 DIAGNOSIS — Z7689 Persons encountering health services in other specified circumstances: Secondary | ICD-10-CM | POA: Diagnosis not present

## 2021-09-06 DIAGNOSIS — Z6841 Body Mass Index (BMI) 40.0 and over, adult: Secondary | ICD-10-CM

## 2021-09-06 MED ORDER — PHENTERMINE HCL 37.5 MG PO CAPS: 37.5000 mg | ORAL_CAPSULE | ORAL | 1 refills | Status: DC

## 2021-09-06 NOTE — Progress Notes (Signed)
° ° °  GYNECOLOGY PROGRESS NOTE  Subjective:    Patient ID: Deanna Newman, female    DOB: 1985/09/03, 36 y.o.   MRN: [id number]  HPI  Patient is a 36 y.o. G57P2012 female who presents for 2 month weight management follow up. She has a past history of obesity and dyslipidemia She initiated use of Phentermine 5 months ago.  Denies any undesirable side effects and reports compliance with medications. Starting weight was 288 lbs (BMI 48) in October.   Notes having issues with a toothache, seen by the dentist and was given antibiotics, which then led to a yeast infection.   Also reports that she is getting married in June.     Current interventions:  1. Diet:  Keto diet, limit portion sizes.  2. Activity: at home work out videos 30 minutes.  3. Reports bowel movements are normal.    The following portions of the patient's history were reviewed and updated as appropriate: allergies, current medications, past family history, past medical history, past social history, past surgical history, and problem list.   Review of Systems Pertinent items noted in HPI and remainder of comprehensive ROS otherwise negative.   Objective:    Vitals with BMI 09/06/2021 08/20/2021 07/28/2021  Height 5\' 5"  5\' 5"  -  Weight 241 lbs 252 lbs 252 lbs 14 oz  BMI 40.1 41.93 -  Systolic 127 129  Diastolic 88 97 87  Pulse 88 104 93    General appearance: alert and no distress Abdomen: soft, non-tender.  Waist circumference 37 in.    Labs:   Lab Results  Component Value Date   CHOL 171 09/14/2020   HDL 33 (L) 09/14/2020   LDLCALC 92 09/14/2020   TRIG 275 (H) 09/14/2020   CHOLHDL 5.2 (H) 09/14/2020    Lab Results  Component Value Date   HGBA1C 5.6 03/24/2020    Assessment:   Weight management Obesity, Body mass index is 40.1 kg/m. Dyslipidemia  Plan:   Weight management  - doing well with weight loss, can continue current management.  Can continue management.  Will recheck lipids and  re-screen for DM today.   09/16/2020, MD [facility]

## 2021-09-07 LAB — GLUCOSE, RANDOM: Glucose: 58 mg/dL — ABNORMAL LOW (ref 70–99)

## 2021-09-07 LAB — LIPID PANEL
Chol/HDL Ratio: 4.5 ratio — ABNORMAL HIGH (ref 0.0–4.4)
Cholesterol, Total: 154 mg/dL (ref 100–199)
HDL: 34 mg/dL — ABNORMAL LOW (ref >39)
LDL Chol Calc (NIH): 95 mg/dL (ref 0–99)
Triglycerides: 141 mg/dL (ref 0–149)
VLDL Cholesterol Cal: 25 mg/dL (ref 5–40)

## 2021-09-19 ENCOUNTER — Encounter: Payer: BC Managed Care – PPO | Admitting: Family Medicine

## 2021-11-04 ENCOUNTER — Encounter: Payer: BC Managed Care – PPO | Admitting: Obstetrics and Gynecology

## 2021-11-09 ENCOUNTER — Other Ambulatory Visit: Payer: Self-pay

## 2021-11-09 ENCOUNTER — Encounter: Payer: Self-pay | Admitting: Obstetrics and Gynecology

## 2021-11-09 ENCOUNTER — Ambulatory Visit (INDEPENDENT_AMBULATORY_CARE_PROVIDER_SITE_OTHER): Payer: BC Managed Care – PPO | Admitting: Obstetrics and Gynecology

## 2021-11-09 VITALS — BP 139/83 | HR 110 | Resp 16 | Ht 65.0 in | Wt 237.7 lb

## 2021-11-09 DIAGNOSIS — E669 Obesity, unspecified: Secondary | ICD-10-CM | POA: Diagnosis not present

## 2021-11-09 DIAGNOSIS — E785 Hyperlipidemia, unspecified: Secondary | ICD-10-CM

## 2021-11-09 DIAGNOSIS — Z6841 Body Mass Index (BMI) 40.0 and over, adult: Secondary | ICD-10-CM

## 2021-11-09 DIAGNOSIS — Z7689 Persons encountering health services in other specified circumstances: Secondary | ICD-10-CM

## 2021-11-09 MED ORDER — PHENTERMINE HCL 37.5 MG PO CAPS: 37.5000 mg | ORAL_CAPSULE | ORAL | 0 refills | Status: DC

## 2021-11-09 MED ORDER — CYANOCOBALAMIN 1000 MCG/ML IJ SOLN
1000.0000 ug | Freq: Once | INTRAMUSCULAR | Status: AC
  Administered 2021-11-09: 1000 ug via INTRAMUSCULAR

## 2021-11-09 NOTE — Progress Notes (Signed)
? ? ?  GYNECOLOGY PROGRESS NOTE ? ?Subjective:  ? ? Patient ID: Deanna Newman, female    DOB: 1985-11-04, 36 y.o.   MRN: [id number] ? ?HPI ? Patient is a 36 y.o. female who presents for 2 month weight management follow up. She has a past history of obesity, dyslipidemia. She initiated use of Phentermine 7 months ago.  Denies any undesirable side effects and reports compliance with medications. Starting weight was 288 lbs (BMI 48) in October.  Goal weight is 200 lbs.  ?  ?Current interventions:  ?1. Diet - Unchanged since previous visit.  Still doing keto diet, limiting portion size.  ?2. Activity - She has not being active as much as she should.  Has been very busy with her kids' activities and church activities lately.  ?3. Reports bowel movements are normal.  ? ?Reports that she is planning to get married in June.  ? ? ?The following portions of the patient's history were reviewed and updated as appropriate: allergies, current medications, past family history, past medical history, past social history, past surgical history, and problem list. ? ?Review of Systems ?Pertinent items noted in HPI and remainder of comprehensive ROS otherwise negative.  ? ?Objective:  ?  ?Vitals with BMI 11/09/2021 09/06/2021 08/20/2021  ?Height 5\' 5"  5\' 5"  5\' 5"   ?Weight 237 lbs 11 oz 241 lbs 252 lbs  ?BMI 39.56 40.1 41.93  ?Systolic 139 127  ?Diastolic 83 88 97  ?Pulse 110 88 104  ? ? ?General appearance: alert, cooperative, and no distress ?Abdomen: soft, non-tender.  Waist circumference 36 in.  ? ? ?Labs:  ? ?Lab Results  ?Component Value Date  ? CHOL 154 09/06/2021  ? CHOL 171 09/14/2020  ? CHOL 157 03/24/2020  ? ?Lab Results  ?Component Value Date  ? HDL 34 (L) 09/06/2021  ? HDL 33 (L) 09/14/2020  ? HDL 31 (L) 03/24/2020  ? ?Lab Results  ?Component Value Date  ? LDLCALC 95 09/06/2021  ? LDLCALC 92 09/14/2020  ? LDLCALC 93 03/24/2020  ? ?Lab Results  ?Component Value Date  ? TRIG 141 09/06/2021  ? TRIG 275 (H) 09/14/2020  ? TRIG  192 (H) 03/24/2020  ? ?Lab Results  ?Component Value Date  ? CHOLHDL 4.5 (H) 09/06/2021  ? CHOLHDL 5.2 (H) 09/14/2020  ? CHOLHDL 5.1 (H) 03/24/2020  ? ?No results found for: LDLDIRECT ? ? ? ?Assessment:  ? ?Weight management ?Obesity, Body mass index is 39.56 kg/m?. ?Dyslipidemia ? ?Plan:  ? ?Weight management  - appears to be reaching a plateau with weight loss.  Discussed different management options including increasing regimen to every other day 2 tabs for 1 month, or addition of Topamax (although patient notes no major issues with appetite suppression or cravings).  Patient ok to try increasing dose for 1 month.  Also will administer B12 injection today.   ?Dyslipidemia -  Triglycerides improved, however HDL still low. Can consider use of supplemental fish oil.  ? ? ? ?11/04/2021, MD ?[facility]  ?

## 2021-11-09 NOTE — Patient Instructions (Signed)
Preventing Unhealthy Weight Gain, Adult Staying at a healthy weight is important to your overall health. When fat builds up in your body, you may become overweight or obese. Being overweight or obese increases your risk of developing various health problems. Unhealthy weight gain is often the result of making unhealthy food choices or not getting enough exercise. You can make changes to your lifestyle to prevent obesity and stay as healthy as possible. How can unhealthy weight gain affect me? Being overweight or obese can cause you to develop joint or bone problems, which can make it hard for you to stay active or do activities you enjoy. Being overweight also puts stress on your heart and lungs and can lead to health problems such as: Diabetes. Heart disease. Some types of cancer. Stroke. Eating healthy, staying active, and having healthy habits can help to prevent unhealthy weight gain and lower your risk for health problems. These lifestyle changes will also help you manage stress and emotions, improve your self-esteem, and connect with friends and family. What can increase my risk? In addition to certain eating and lifestyle choices, some other factors that may make you more likely to have unhealthy weight gain include: Having a family history of obesity. Living in an area with limited access to: Parks, recreation centers, or sidewalks. Healthy food choices, such as grocery stores and farmers' markets. What actions can I take to prevent unhealthy weight gain? Nutrition  Eat only as much as your body needs. To do this: Pay attention to signs that you are hungry or full. Stop eating as soon as you feel full. If you feel hungry, try drinking water first before eating. Drink enough water so your urine is pale yellow. Eat smaller portions. Pay attention to portion sizes when eating out. Look at serving sizes on food labels. Most foods contain more than one serving per container. Eat the  recommended number of calories for your gender and activity level. For most active people, a daily total of 2,000 calories is appropriate. If you are trying to lose weight or are not very active, you may need to eat fewer calories. Talk with your health care provider or a dietitian about how many calories you need each day. Choose healthy foods, such as: Fruits and vegetables. At each meal, try to fill at least half of your plate with fruits and vegetables. Whole grains, such as whole-wheat bread, brown rice, and quinoa. Lean meats, such as chicken or fish. Other healthy proteins, such as beans, eggs, or tofu. Healthy fats, such as nuts, seeds, fatty fish, and olive oil. Low-fat or fat-free dairy products. Check food labels, and avoid food and drinks that: Are high in calories. Have added sugar. Are high in sodium. Have saturated fats or trans fats. Cook foods in healthier ways, such as by baking, broiling, or grilling. Make a meal plan for the week, and shop with a grocery list to help you stay on track with your purchases. Try to avoid going to the grocery store when you are hungry. When grocery shopping, try to shop around the outside of the store first, where the fresh foods are. Doing this helps you avoid prepackaged foods, which can be high in sugar, salt (sodium), and fat. Lifestyle  Exercise for 30 or more minutes on 5 or more days each week. Exercising may include brisk walking, yard work, biking, running, swimming, and team sports like basketball and soccer. Ask your health care provider which exercises are safe for you. Do activities   that strengthen the muscles, such as lifting weights or using resistance bands, on 2 or more days a week. Do not use any products that contain nicotine or tobacco. These products include cigarettes, chewing tobacco, and vaping devices, such as e-cigarettes. If you need help quitting, ask your health care provider. If you drink alcohol: Limit how much you  have to: 0-1 drink a day for women who are not pregnant. 0-2 drinks a day for men. Know how much alcohol is in a drink. In the [place] one drink equals one 12 oz bottle of beer (355 mL), one 5 oz glass of wine (148 mL), or one 1 oz glass of hard liquor (44 mL). Try to get 7-9 hours of sleep each night. Other changes Keep a food and activity journal to keep track of: What you ate and how many calories you had. Remember to count the calories in sauces, dressings, and side dishes. Whether you were active, and what exercises you did. Your calorie, weight, and activity goals. Check your weight regularly. Track any changes. If you notice that you have gained weight, make changes to your diet or activity routine. Avoid taking weight-loss medicines or supplements. Talk to your health care provider before starting any new medicine or supplement. Talk to your health care provider before trying any new diet or exercise plan. Where to find more information Talk with your health care provider or a dietitian about healthy eating and healthy lifestyle choices. You may also find information from: [place] Department of Agriculture, MyPlate: www.choosemyplate.gov American Heart Association: www.heart.org Centers for Disease Control and Prevention: www.cdc.gov Summary Eating healthy, staying active, and having healthy habits can help to prevent unhealthy weight gain and lower your risk for health problems such as heart disease, diabetes, some types of cancer, and stroke. Being overweight or obese can cause you to develop joint or bone problems, which can make it hard for you to stay active or do activities you enjoy. You can prevent unhealthy weight gain by eating a healthy diet, exercising regularly, not smoking, limiting alcohol, and getting enough sleep. Talk with your health care provider or a dietitian for guidance about healthy eating and healthy lifestyle choices. This information is not intended to replace  advice given to you by your health care provider. Make sure you discuss any questions you have with your health care provider. Document Revised: 03/11/2021 Document Reviewed: 03/11/2021 Elsevier Patient Education  2022 Elsevier Inc.  

## 2021-11-11 LAB — FETAL NONSTRESS TEST

## 2021-12-06 ENCOUNTER — Encounter: Payer: Self-pay | Admitting: Physician Assistant

## 2021-12-06 ENCOUNTER — Ambulatory Visit: Payer: BC Managed Care – PPO | Admitting: Physician Assistant

## 2021-12-06 ENCOUNTER — Ambulatory Visit: Payer: Self-pay | Admitting: *Deleted

## 2021-12-06 VITALS — BP 126/84 | HR 79 | Temp 98.2°F | Resp 14 | Wt 233.0 lb

## 2021-12-06 DIAGNOSIS — J029 Acute pharyngitis, unspecified: Secondary | ICD-10-CM

## 2021-12-06 MED ORDER — AMOXICILLIN 500 MG PO CAPS
500.0000 mg | ORAL_CAPSULE | Freq: Two times a day (BID) | ORAL | 0 refills | Status: AC
Start: 2021-12-06 — End: 2021-12-16

## 2021-12-06 MED ORDER — FLUCONAZOLE 150 MG PO TABS: 150.0000 mg | ORAL_TABLET | Freq: Once | ORAL | 0 refills | Status: AC

## 2021-12-06 NOTE — Progress Notes (Signed)
? ? ?  GYNECOLOGY PROGRESS NOTE ? ?Subjective:  ? ? Patient ID: Deanna Newman, female    DOB: 06-27-1986, 36 y.o.   MRN: [id number] ? ?HPI ? Patient is a 36 y.o. female who presents for 1 month weight management follow up. She has a past history of obesity, dyslipidemia. She initiated use of Phentermine 8 months ago.  Denies any undesirable side effects and reports compliance with medications. Dose was increased last month due to plateau in weight loss.  Taking alternating 2 pill regimen. Reports that she has not taken her medication in the past 1.5 weeks as she had a death in the family and was extremely busy and forgot to take it, and then one of her sons was diagnosed with Strep throat and she was not feeling well for several days. Has not been eating much. Starting weight was 288 lbs (BMI 48) in October.  Goal weight is 200 lbs.  ?  ?Current interventions:  ?1. Diet - Unchanged since previous visit.  Still doing keto diet, limiting portion size.  ?2. Activity - She has not being active as much as she should.  Has been very busy with her kids' activities and church activities lately.  ?3. Reports bowel movements are normal.  ? ?Reports that she is planning to get married in June.  ? ? ?The following portions of the patient's history were reviewed and updated as appropriate: allergies, current medications, past family history, past medical history, past social history, past surgical history, and problem list. ? ?Review of Systems ?Pertinent items noted in HPI and remainder of comprehensive ROS otherwise negative.  ? ?Objective:  ?  ? ?  12/07/2021  ?  9:38 AM 12/06/2021  ?  1:36 PM 11/09/2021  ?  1:33 PM  ?Vitals with BMI  ?Height 5\' 5"   5\' 5"   ?Weight 231 lbs 5 oz 233 lbs 237 lbs 11 oz  ?BMI 38.49  39.56  ?Systolic 128 126  ?Diastolic 83 84 83  ?Pulse 89 79 110  ? ? ?General appearance: alert, cooperative, and no distress ?Abdomen: soft, non-tender.  Waist circumference 34 in.  ? ? ?Labs:  ? ?Lab Results   ?Component Value Date  ? CHOL 154 09/06/2021  ? CHOL 171 09/14/2020  ? CHOL 157 03/24/2020  ? ?Lab Results  ?Component Value Date  ? HDL 34 (L) 09/06/2021  ? HDL 33 (L) 09/14/2020  ? HDL 31 (L) 03/24/2020  ? ?Lab Results  ?Component Value Date  ? LDLCALC 95 09/06/2021  ? LDLCALC 92 09/14/2020  ? LDLCALC 93 03/24/2020  ? ?Lab Results  ?Component Value Date  ? TRIG 141 09/06/2021  ? TRIG 275 (H) 09/14/2020  ? TRIG 192 (H) 03/24/2020  ? ?Lab Results  ?Component Value Date  ? CHOLHDL 4.5 (H) 09/06/2021  ? CHOLHDL 5.2 (H) 09/14/2020  ? CHOLHDL 5.1 (H) 03/24/2020  ? ?No results found for: LDLDIRECT ? ? ? ?Assessment:  ? ?Weight management ?Obesity, Body mass index is 38.49 kg/m?. ?Dyslipidemia ? ?Plan:  ? ?Weight management  - has had some weight loss despite being off medication for over a week. Can continue current regimen until patient's wedding in June.  Then will take a hiatus from medication.   Will hold on B12 injection today as patient unsure if it made much difference.    ?Dyslipidemia -  Will recheck cholesterol at next visit.  ? ? ?03/26/2020, MD ?[facility]  ? ?

## 2021-12-06 NOTE — Telephone Encounter (Signed)
No out call. Pt was able to get appt today via the agent. Triage call canceled. ?

## 2021-12-06 NOTE — Progress Notes (Signed)
?  ? ?I,Deanna Newman,acting as a Neurosurgeon for Deanna Newman.,have documented all relevant documentation on the behalf of Deanna Newman,as directed by  Deanna Newman while in the presence of Deanna Newman.  ? ?Established patient visit ? ? ?Patient: Deanna Newman   DOB: 01-22-1986   36 y.o. Female  MRN: [id number] ?Visit Date: 12/06/2021 ? ?Today's healthcare provider: Debera Newman, Newman  ? ?Chief Complaint  ?Patient presents with  ? Sore Throat  ? ?Subjective  ?  ?Sore Throat  ?This is a new problem. The current episode started yesterday. The problem has been rapidly worsening. There has been no fever. Associated symptoms include abdominal pain and headaches. Pertinent negatives include no shortness of breath or vomiting.   ?Patient states her son tested positive for strep throat this morning.  ? ?Medications: ?Outpatient Medications Prior to Visit  ?Medication Sig  ? albuterol (VENTOLIN HFA) 108 (90 Base) MCG/ACT inhaler Inhale 2 puffs into the lungs every 6 (six) hours as needed for wheezing or shortness of breath. (Patient not taking: Reported on 12/06/2021)  ? phentermine (ADIPEX-P) 37.5 MG capsule Take 1 capsule (37.5 mg total) by mouth every morning. Alternate with every other day 2 tab dosing. (Patient not taking: Reported on 12/06/2021)  ? ?No facility-administered medications prior to visit.  ? ? ?Review of Systems  ?Constitutional:  Positive for chills and fatigue. Negative for appetite change and fever.  ?HENT:  Positive for sore throat.   ?Respiratory:  Negative for chest tightness and shortness of breath.   ?Cardiovascular:  Negative for chest pain and palpitations.  ?Gastrointestinal:  Positive for abdominal pain. Negative for nausea and vomiting.  ?Neurological:  Positive for dizziness and headaches. Negative for weakness.  ? ? ?  Objective  ?  ?BP 126/84 (BP Location: Right Arm, Patient Position: Sitting, Cuff Size: Large)   Pulse 79   Temp 98.2 ?F (36.8 ?C) (Oral)    Resp 14   Wt 233 lb (105.7 kg)   LMP 11/28/2021   SpO2 100%   BMI 38.77 kg/m?  ? ? ?Physical Exam ?Vitals and nursing note reviewed.  ?Constitutional:   ?   General: She is not in acute distress. ?   Appearance: She is well-developed.  ?HENT:  ?   Head: Normocephalic and atraumatic.  ?   Right Ear: Tympanic membrane and ear canal normal.  ?   Left Ear: Tympanic membrane and ear canal normal.  ?   Nose: No congestion or rhinorrhea.  ?   Mouth/Throat:  ?   Mouth: No oral lesions.  ?   Pharynx: Posterior oropharyngeal erythema present. No pharyngeal swelling, oropharyngeal exudate or uvula swelling.  ?   Tonsils: No tonsillar exudate.  ?Cardiovascular:  ?   Rate and Rhythm: Regular rhythm.  ?   Heart sounds: Normal heart sounds.  ?Pulmonary:  ?   Effort: Pulmonary effort is normal.  ?   Breath sounds: Normal breath sounds.  ?Chest:  ?   Chest wall: No tenderness.  ?Lymphadenopathy:  ?   Cervical: Cervical adenopathy present.  ?Neurological:  ?   Mental Status: She is alert and oriented to person, place, and time.  ?Psychiatric:     ?   Behavior: Behavior normal.  ?  ? ? ?No results found for any visits on 12/06/21. ? Assessment & Plan  ?  ? ?1. Sore throat ?Suspected due to strep pharyngitis ?Her son tested positive for strep throat this morning ? ?- POCT rapid  strep A negative ?- OTC pain medications, drink plenty of water, tea and honey, warm salt gargles, lozenges for sore throat ?- if symptoms worsen, start abx ?Patient was instructed not to fill it unless their symptoms worsen  ?Discussed the potential negative side effects of antibiotics and that they can lead to resistance if used unnecessarily ?Recommended prevention strategies such as hand washing ?Made a plan with the patient to outline what to do if symptoms worsen or do not mprove, or if they develop concerning symptoms like high fever, SOB ?- amoxicillin (AMOXIL) 500 MG capsule; Take 1 capsule (500 mg total) by mouth 2 (two) times daily for 10 days.   Dispense: 30 capsule; Refill: 0 ?- fluconazole (DIFLUCAN) 150 MG tablet; Take 1 tablet (150 mg total) by mouth once for 1 dose.  Dispense: 1 tablet; Refill: 0 ? ?Sore throat will resolve within one to three days. Patient can return to work,after 12 to 24 hours of antibiotic therapy, provided patient is afebrile and otherwise well.  ?  ?The patient was advised to call back or seek an in-person evaluation if the symptoms worsen or if the condition fails to improve as anticipated. ? ?I discussed the assessment and treatment plan with the patient. The patient was provided an opportunity to ask questions and all were answered. The patient agreed with the plan and demonstrated an understanding of the instructions. ? ?The entirety of the information documented in the History of Present Illness, Review of Systems and Physical Exam were personally obtained by me. Portions of this information were initially documented by the CMA and reviewed by me for thoroughness and accuracy.   ? ? ?Deanna Newman  ?[facility] ?[phone] (phone) ?[phone] (fax) ? ?[facility]  ?

## 2021-12-07 ENCOUNTER — Telehealth: Payer: BC Managed Care – PPO | Admitting: Physician Assistant

## 2021-12-07 ENCOUNTER — Encounter: Payer: Self-pay | Admitting: Obstetrics and Gynecology

## 2021-12-07 ENCOUNTER — Ambulatory Visit (INDEPENDENT_AMBULATORY_CARE_PROVIDER_SITE_OTHER): Payer: BC Managed Care – PPO | Admitting: Obstetrics and Gynecology

## 2021-12-07 ENCOUNTER — Ambulatory Visit: Payer: BC Managed Care – PPO | Admitting: Family Medicine

## 2021-12-07 VITALS — BP 128/83 | HR 89 | Resp 16 | Ht 65.0 in | Wt 231.3 lb

## 2021-12-07 DIAGNOSIS — Z7689 Persons encountering health services in other specified circumstances: Secondary | ICD-10-CM | POA: Diagnosis not present

## 2021-12-07 DIAGNOSIS — E785 Hyperlipidemia, unspecified: Secondary | ICD-10-CM

## 2021-12-07 DIAGNOSIS — E669 Obesity, unspecified: Secondary | ICD-10-CM | POA: Diagnosis not present

## 2021-12-07 LAB — POCT RAPID STREP A (OFFICE): Rapid Strep A Screen: NEGATIVE

## 2021-12-07 MED ORDER — PHENTERMINE HCL 37.5 MG PO CAPS: 37.5000 mg | ORAL_CAPSULE | ORAL | 1 refills | Status: DC

## 2021-12-07 NOTE — Patient Instructions (Addendum)
? ?  Sore throat will resolve within one to three days. Patient can return to work,after 12 to 24 hours of antibiotic therapy, provided patient is afebrile and otherwise well.  ?

## 2021-12-26 ENCOUNTER — Encounter: Payer: Self-pay | Admitting: Obstetrics and Gynecology

## 2022-01-05 ENCOUNTER — Other Ambulatory Visit: Payer: Self-pay | Admitting: Obstetrics and Gynecology

## 2022-01-30 NOTE — Progress Notes (Unsigned)
    GYNECOLOGY PROGRESS NOTE  Subjective:    Patient ID: Deanna Newman, female    DOB: [birth year], 36 y.o.   MRN: [id number]  HPI  Patient is a 36 y.o. female who presents for 3 month weight management follow up. She has a past history of obesity, dyslipidemia. She initiated use of Phentermine 9.5 months ago.  Denies any undesirable side effects and reports compliance with medications. Taking alternating 2 pill regimen (dose increased 2 months ago due to plateau in weight loss). Reports that she has not taken her medication in the past 1.5 weeks as she had a death in the family and was extremely busy and forgot to take it, and then one of her sons was diagnosed with Strep throat and she was not feeling well for several days. Has not been eating much. Starting weight was 288 lbs (BMI 48) in October.  Goal weight is 200 lbs.   Patient notes also notes that her cycles have now become more regular since beginning weight loss. Has h/o irregular cycles.   Patient is planning on getting married this weekend! Feels very happy about her weight loss accomplishments thus far.    Current interventions:  1. Diet - There has been no change in her diet since previous visit. 2. Activity - She continues to exercise daily for 30 minutes 3. Reports bowel movements are normal.    The following portions of the patient's history were reviewed and updated as appropriate: allergies, current medications, past family history, past medical history, past social history, past surgical history, and problem list.  Review of Systems Pertinent items are noted in HPI.   Objective:       02/01/2022    8:32 AM 12/07/2021    9:38 AM 12/06/2021    1:36 PM  Vitals with BMI  Height 5\' 5"  5\' 5"    Weight 224 lbs 13 oz 231 lbs 5 oz 233 lbs  BMI 37.41 38.49   Systolic 111 128  Diastolic 70 83 84  Pulse 105 89 79    General appearance: alert, cooperative, and no distress Abdomen: soft, non-tender.  Waist circumference  43 in.    Labs:   Assessment:   1. Encounter for weight management   2. Obesity (BMI 30-39.9)   3. Dyslipidemia   4. History of irregular menstrual cycles     Plan:   Weight management  - doing well with weight loss, with increased dose, however patient has been on medication for 9 months. Recommend hiatus at this time.   Advised that she can take a 3 month break, and then if she desires to resume after this time to continue weight loss journey with medications, she can resume.  Dyslipidemia - will check levels next month at wellness visit History of irregular cycles - notes cycles have become more regulated since losing weight. Is very happy about this.     , MD [facility]

## 2022-02-01 ENCOUNTER — Ambulatory Visit (INDEPENDENT_AMBULATORY_CARE_PROVIDER_SITE_OTHER): Payer: BC Managed Care – PPO | Admitting: Obstetrics and Gynecology

## 2022-02-01 ENCOUNTER — Encounter: Payer: Self-pay | Admitting: Obstetrics and Gynecology

## 2022-02-01 VITALS — BP 111/70 | HR 105 | Resp 16 | Ht 65.0 in | Wt 224.8 lb

## 2022-02-01 DIAGNOSIS — E669 Obesity, unspecified: Secondary | ICD-10-CM | POA: Diagnosis not present

## 2022-02-01 DIAGNOSIS — Z6830 Body mass index (BMI) 30.0-30.9, adult: Secondary | ICD-10-CM

## 2022-02-01 DIAGNOSIS — Z7689 Persons encountering health services in other specified circumstances: Secondary | ICD-10-CM | POA: Diagnosis not present

## 2022-02-01 DIAGNOSIS — Z8742 Personal history of other diseases of the female genital tract: Secondary | ICD-10-CM

## 2022-02-01 DIAGNOSIS — E785 Hyperlipidemia, unspecified: Secondary | ICD-10-CM

## 2022-04-03 NOTE — Progress Notes (Unsigned)
GYNECOLOGY ANNUAL PHYSICAL EXAM PROGRESS NOTE  Subjective:    Deanna Newman is a 36 y.o. [id number] female who presents for an annual exam. The patient has no complaints today. The patient is sexually active. The patient participates in regular exercise: yes. Has the patient ever been transfused or tattooed?: yes. The patient reports that there is not domestic violence in her life.    Menstrual History: Menarche age: 65 Patient's last menstrual period was 02/13/2021. Period Duration (Days): 8 Period Pattern: (!) Irregular Menstrual Flow: Heavy Menstrual Control: Maxi pad, Tampon Menstrual Control Change Freq (Hours): 1 Dysmenorrhea: (!) Moderate Dysmenorrhea Symptoms: Cramping, Nausea, Headache Gynecologic History:  Contraception: none History of STI's: Denies Last Pap: 03/24/2020. Results were: normal.  History of one abnormal pap in ~2011 per pt.     OB History  Gravida Para Term Preterm AB Living  3 2 2  0 1 2  SAB IAB Ectopic Multiple Live Births  1 0 0 0 2    # Outcome Date GA Lbr Len/2nd Weight Sex Delivery Anes PTL Lv  3 Term 07/31/18 [redacted]w[redacted]d  8 lb 5.3 oz (3.78 kg) M CS-LTranv Spinal  LIV     Name: Kinnan,BOY Sandrine     Apgar1: 9  Apgar5: 9  2 SAB 11/11/16 [redacted]w[redacted]d         1 Term 05/23/12 [redacted]w[redacted]d  8 lb 4 oz (3.742 kg) M CS-LTranv   LIV    Past Medical History:  Diagnosis Date   Anemia    Asthma    Cataract    Gestational hypertension    Migraine    Vaginal Pap smear, abnormal 2010   followed by colposcopy    Past Surgical History:  Procedure Laterality Date   CESAREAN SECTION     CESAREAN SECTION N/A 07/31/2018   Procedure: REPEAT CESAREAN SECTION;  Surgeon: 14/11/2017, MD;  Location: [facility] ORS;  Service: Obstetrics;  Laterality: N/A;  Time of Birth; 15:[birth year] Sex: Female Wt: 8 LB 5 oz   WISDOM TOOTH EXTRACTION      Family History  Problem Relation Age of Onset   Skin cancer Mother    GER disease Mother    Sleep apnea Mother    Diabetes Father    COPD  Father        smoker   Asthma Father    Hypertension Father    Heart failure Father    Sleep apnea Father    Hypertension Sister    Anxiety disorder Sister    Obesity Sister    COPD Paternal Grandmother    Heart disease Paternal Grandmother    Diabetes Paternal Grandmother    Lung cancer Paternal Grandmother        smoker   Skin cancer Maternal Grandmother    Healthy Brother     Social History   Socioeconomic History   Marital status: Divorced    Spouse name: Not on file   Number of children: 1   Years of education: Not on file   Highest education level: Not on file  Occupational History   Occupation: Admin Specialist  Tobacco Use   Smoking status: Never   Smokeless tobacco: Never  Vaping Use   Vaping Use: Never used  Substance and Sexual Activity   Alcohol use: Yes    Comment: Occasionally    Drug use: No   Sexual activity: Yes    Birth control/protection: None  Other Topics Concern   Not on file  Social History Narrative  Not on file   Social Determinants of Health   Financial Resource Strain: Low Risk  (07/31/2018)   Overall Financial Resource Strain (CARDIA)    Difficulty of Paying Living Expenses: Not hard at all  Food Insecurity: No Food Insecurity (07/31/2018)   Hunger Vital Sign    Worried About Running Out of Food in the Last Year: Never true    Ran Out of Food in the Last Year: Never true  Transportation Needs: No Transportation Needs (07/31/2018)   PRAPARE - Administrator, Civil Service (Medical): No    Lack of Transportation (Non-Medical): No  Physical Activity: Inactive (07/31/2018)   Exercise Vital Sign    Days of Exercise per Week: 0 days    Minutes of Exercise per Session: 0 min  Stress: Stress Concern Present (07/31/2018)   [employer] of Occupational Health - Occupational Stress Questionnaire    Feeling of Stress : To some extent  Social Connections: Moderately Integrated (07/31/2018)   Social Connection and Isolation  Panel [NHANES]    Frequency of Communication with Friends and Family: More than three times a week    Frequency of Social Gatherings with Friends and Family: More than three times a week    Attends Religious Services: 1 to 4 times per year    Active Member of [employer] or Organizations: No    Attends Banker Meetings: Never    Marital Status: Living with partner  Intimate Partner Violence: Not on file    Current Outpatient Medications on File Prior to Visit  Medication Sig Dispense Refill   phentermine (ADIPEX-P) 37.5 MG capsule Take 1 capsule (37.5 mg total) by mouth every morning. Alternate with every other day 2 tab dosing. 45 capsule 1   No current facility-administered medications on file prior to visit.    Allergies  Allergen Reactions   Fentanyl Itching    Makes pt want to scratch skin off    Sumatriptan Anaphylaxis     Review of Systems Constitutional: negative for chills, fatigue, fevers and sweats Eyes: negative for irritation, redness and visual disturbance Ears, nose, mouth, throat, and face: negative for hearing loss, nasal congestion, snoring and tinnitus Respiratory: negative for asthma, cough, sputum Cardiovascular: negative for chest pain, dyspnea, exertional chest pressure/discomfort, irregular heart beat, palpitations and syncope Gastrointestinal: negative for abdominal pain, change in bowel habits, nausea and vomiting Genitourinary: negative for abnormal menstrual periods, genital lesions, sexual problems and vaginal discharge, dysuria and urinary incontinence Integument/breast: negative for breast lump, breast tenderness and nipple discharge Hematologic/lymphatic: negative for bleeding and easy bruising Musculoskeletal:negative for back pain and muscle weakness Neurological: negative for dizziness, headaches, vertigo and weakness Endocrine: negative for diabetic symptoms including polydipsia, polyuria and skin dryness Allergic/Immunologic: negative  for hay fever and urticaria      Objective:  currently breastfeeding. There is no height or weight on file to calculate BMI.    General Appearance:    Alert, cooperative, no distress, appears stated age  Head:    Normocephalic, without obvious abnormality, atraumatic  Eyes:    PERRL, conjunctiva/corneas clear, EOM's intact, both eyes  Ears:    Normal external ear canals, both ears  Nose:   Nares normal, septum midline, mucosa normal, no drainage or sinus tenderness  Throat:   Lips, mucosa, and tongue normal; teeth and gums normal  Neck:   Supple, symmetrical, trachea midline, no adenopathy; thyroid: no enlargement/tenderness/nodules; no carotid bruit or JVD  Back:     Symmetric, no curvature, ROM  normal, no CVA tenderness  Lungs:     Clear to auscultation bilaterally, respirations unlabored  Chest Wall:    No tenderness or deformity   Heart:    Regular rate and rhythm, S1 and S2 normal, no murmur, rub or gallop  Breast Exam:    No tenderness, masses, or nipple abnormality  Abdomen:     Soft, non-tender, bowel sounds active all four quadrants, no masses, no organomegaly.    Genitalia:    Pelvic:external genitalia normal, vagina without lesions, discharge, or tenderness, rectovaginal septum  normal. Cervix normal in appearance, no cervical motion tenderness, no adnexal masses or tenderness.  Uterus normal size, shape, mobile, regular contours, nontender.  Rectal:    Normal external sphincter.  No hemorrhoids appreciated. Internal exam not done.   Extremities:   Extremities normal, atraumatic, no cyanosis or edema  Pulses:   2+ and symmetric all extremities  Skin:   Skin color, texture, turgor normal, no rashes or lesions  Lymph nodes:   Cervical, supraclavicular, and axillary nodes normal  Neurologic:   CNII-XII intact, normal strength, sensation and reflexes throughout   .  Labs:  Lab Results  Component Value Date   WBC 7.7 07/28/2021   HGB 10.8 (L) 07/28/2021   HCT 34.8 07/28/2021    MCV 75 (L) 07/28/2021   PLT 405 07/28/2021    Lab Results  Component Value Date   CREATININE 0.68 09/14/2020   BUN 12 09/14/2020   NA 139 09/14/2020   K 4.4 09/14/2020   CL 103 09/14/2020   CO2 26 09/14/2020    Lab Results  Component Value Date   ALT 28 09/14/2020   AST 19 09/14/2020   ALKPHOS 96 09/14/2020   BILITOT <0.2 09/14/2020    Lab Results  Component Value Date   TSH 1.210 03/24/2020     Assessment:   No diagnosis found.   Plan:  Blood tests: {blood tests:13147}. Breast self exam technique reviewed and patient encouraged to perform self-exam monthly. Contraception: none. Discussed healthy lifestyle modifications. Mammogram  Not age appropriate Pap smear  UTD . COVID vaccination status: Follow up in 1 year for annual exam   Hildred Laser, MD [facility]

## 2022-04-04 ENCOUNTER — Ambulatory Visit (INDEPENDENT_AMBULATORY_CARE_PROVIDER_SITE_OTHER): Payer: BC Managed Care – PPO | Admitting: Obstetrics and Gynecology

## 2022-04-04 ENCOUNTER — Encounter: Payer: Self-pay | Admitting: Obstetrics and Gynecology

## 2022-04-04 VITALS — BP 117/76 | HR 78 | Resp 16 | Ht 65.0 in | Wt 239.6 lb

## 2022-04-04 DIAGNOSIS — R5383 Other fatigue: Secondary | ICD-10-CM | POA: Diagnosis not present

## 2022-04-04 DIAGNOSIS — Z6841 Body Mass Index (BMI) 40.0 and over, adult: Secondary | ICD-10-CM | POA: Diagnosis not present

## 2022-04-04 DIAGNOSIS — Z01411 Encounter for gynecological examination (general) (routine) with abnormal findings: Secondary | ICD-10-CM | POA: Diagnosis not present

## 2022-04-04 DIAGNOSIS — Z01419 Encounter for gynecological examination (general) (routine) without abnormal findings: Secondary | ICD-10-CM

## 2022-04-04 NOTE — Patient Instructions (Signed)
Breast Self-Awareness Breast self-awareness is knowing how your breasts look and feel. You need to: Check your breasts on a regular basis. Tell your doctor about any changes. Become familiar with the look and feel of your breasts. This can help you catch a breast problem while it is still small and can be treated. You should do breast self-exams even if you have breast implants. What you need: A mirror. A well-lit room. A pillow or other soft object. How to do a breast self-exam Follow these steps to do a breast self-exam: Look for changes  Take off all the clothes above your waist. Stand in front of a mirror in a room with good lighting. Put your hands down at your sides. Compare your breasts in the mirror. Look for any difference between them, such as: A difference in shape. A difference in size. Wrinkles, dips, and bumps in one breast and not the other. Look at each breast for changes in the skin, such as: Redness. Scaly areas. Skin that has gotten thicker. Dimpling. Open sores (ulcers). Look for changes in your nipples, such as: Fluid coming out of a nipple. Fluid around a nipple. Bleeding. Dimpling. Redness. A nipple that looks pushed in (retracted), or that has changed position. Feel for changes Lie on your back. Feel each breast. To do this: Pick a breast to feel. Place a pillow under the shoulder closest to that breast. Put the arm closest to that breast behind your head. Feel the nipple area of that breast using the hand of your other arm. Feel the area with the pads of your three middle fingers by making small circles with your fingers. Use light, medium, and firm pressure. Continue the overlapping circles, moving downward over the breast. Keep making circles with your fingers. Stop when you feel your ribs. Start making circles with your fingers again, this time going upward until you reach your collarbone. Then, make circles outward across your breast and into your  armpit area. Squeeze your nipple. Check for discharge and lumps. Repeat these steps to check your other breast. Sit or stand in the tub or shower. With soapy water on your skin, feel each breast the same way you did when you were lying down. Write down what you find Writing down what you find can help you remember what to tell your doctor. Write down: What is normal for each breast. Any changes you find in each breast. These include: The kind of changes you find. A tender or painful breast. Any lump you find. Write down its size and where it is. When you last had your monthly period (menstrual cycle). General tips If you are breastfeeding, the best time to check your breasts is after you feed your baby or after you use a breast pump. If you get monthly bleeding, the best time to check your breasts is 5-7 days after your monthly cycle ends. With time, you will become comfortable with the self-exam. You will also start to know if there are changes in your breasts. Contact a doctor if: You see a change in the shape or size of your breasts or nipples. You see a change in the skin of your breast or nipples, such as red or scaly skin. You have fluid coming from your nipples that is not normal. You find a new lump or thick area. You have breast pain. You have any concerns about your breast health. Summary Breast self-awareness includes looking for changes in your breasts and feeling for changes   within your breasts. You should do breast self-awareness in front of a mirror in a well-lit room. If you get monthly periods (menstrual cycles), the best time to check your breasts is 5-7 days after your period ends. Tell your doctor about any changes you see in your breasts. Changes include changes in size, changes on the skin, painful or tender breasts, or fluid from your nipples that is not normal. This information is not intended to replace advice given to you by your health care provider. Make sure  you discuss any questions you have with your health care provider. Document Revised: 06/16/2021 Document Reviewed: 06/16/2021 Elsevier Patient Education  2023 Elsevier Inc. Preventive Care 21-39 Years Old, Female Preventive care refers to lifestyle choices and visits with your health care provider that can promote health and wellness. Preventive care visits are also called wellness exams. What can I expect for my preventive care visit? Counseling During your preventive care visit, your health care provider may ask about your: Medical history, including: Past medical problems. Family medical history. Pregnancy history. Current health, including: Menstrual cycle. Method of birth control. Emotional well-being. Home life and relationship well-being. Sexual activity and sexual health. Lifestyle, including: Alcohol, nicotine or tobacco, and drug use. Access to firearms. Diet, exercise, and sleep habits. Work and work environment. Sunscreen use. Safety issues such as seatbelt and bike helmet use. Physical exam Your health care provider may check your: Height and weight. These may be used to calculate your BMI (body mass index). BMI is a measurement that tells if you are at a healthy weight. Waist circumference. This measures the distance around your waistline. This measurement also tells if you are at a healthy weight and may help predict your risk of certain diseases, such as type 2 diabetes and high blood pressure. Heart rate and blood pressure. Body temperature. Skin for abnormal spots. What immunizations do I need?  Vaccines are usually given at various ages, according to a schedule. Your health care provider will recommend vaccines for you based on your age, medical history, and lifestyle or other factors, such as travel or where you work. What tests do I need? Screening Your health care provider may recommend screening tests for certain conditions. This may include: Pelvic exam  and Pap test. Lipid and cholesterol levels. Diabetes screening. This is done by checking your blood sugar (glucose) after you have not eaten for a while (fasting). Hepatitis B test. Hepatitis C test. HIV (human immunodeficiency virus) test. STI (sexually transmitted infection) testing, if you are at risk. BRCA-related cancer screening. This may be done if you have a family history of breast, ovarian, tubal, or peritoneal cancers. Talk with your health care provider about your test results, treatment options, and if necessary, the need for more tests. Follow these instructions at home: Eating and drinking  Eat a healthy diet that includes fresh fruits and vegetables, whole grains, lean protein, and low-fat dairy products. Take vitamin and mineral supplements as recommended by your health care provider. Do not drink alcohol if: Your health care provider tells you not to drink. You are pregnant, may be pregnant, or are planning to become pregnant. If you drink alcohol: Limit how much you have to 0-1 drink a day. Know how much alcohol is in your drink. In the [place] one drink equals one 12 oz bottle of beer (355 mL), one 5 oz glass of wine (148 mL), or one 1 oz glass of hard liquor (44 mL). Lifestyle Brush your teeth every morning and   night with fluoride toothpaste. Floss one time each day. Exercise for at least 30 minutes 5 or more days each week. Do not use any products that contain nicotine or tobacco. These products include cigarettes, chewing tobacco, and vaping devices, such as e-cigarettes. If you need help quitting, ask your health care provider. Do not use drugs. If you are sexually active, practice safe sex. Use a condom or other form of protection to prevent STIs. If you do not wish to become pregnant, use a form of birth control. If you plan to become pregnant, see your health care provider for a prepregnancy visit. Find healthy ways to manage stress, such as: Meditation, yoga, or  listening to music. Journaling. Talking to a trusted person. Spending time with friends and family. Minimize exposure to UV radiation to reduce your risk of skin cancer. Safety Always wear your seat belt while driving or riding in a vehicle. Do not drive: If you have been drinking alcohol. Do not ride with someone who has been drinking. If you have been using any mind-altering substances or drugs. While texting. When you are tired or distracted. Wear a helmet and other protective equipment during sports activities. If you have firearms in your house, make sure you follow all gun safety procedures. Seek help if you have been physically or sexually abused. What's next? Go to your health care provider once a year for an annual wellness visit. Ask your health care provider how often you should have your eyes and teeth checked. Stay up to date on all vaccines. This information is not intended to replace advice given to you by your health care provider. Make sure you discuss any questions you have with your health care provider. Document Revised: 02/09/2021 Document Reviewed: 02/09/2021 Elsevier Patient Education  2023 Elsevier Inc.  

## 2022-04-05 ENCOUNTER — Other Ambulatory Visit: Payer: Self-pay | Admitting: Obstetrics and Gynecology

## 2022-04-05 LAB — COMPREHENSIVE METABOLIC PANEL
ALT: 8 IU/L (ref 0–32)
AST: 11 IU/L (ref 0–40)
Albumin/Globulin Ratio: 1.7 (ref 1.2–2.2)
Albumin: 4.3 g/dL (ref 3.9–4.9)
Alkaline Phosphatase: 72 IU/L (ref 44–121)
BUN/Creatinine Ratio: 18 (ref 9–23)
BUN: 11 mg/dL (ref 6–20)
Bilirubin Total: 0.2 mg/dL (ref 0.0–1.2)
CO2: 23 mmol/L (ref 20–29)
Calcium: 8.9 mg/dL (ref 8.7–10.2)
Chloride: 105 mmol/L (ref 96–106)
Creatinine, Ser: 0.62 mg/dL (ref 0.57–1.00)
Globulin, Total: 2.5 g/dL (ref 1.5–4.5)
Glucose: 76 mg/dL (ref 70–99)
Potassium: 4.4 mmol/L (ref 3.5–5.2)
Sodium: 138 mmol/L (ref 134–144)
Total Protein: 6.8 g/dL (ref 6.0–8.5)
eGFR: 120 mL/min/{1.73_m2} (ref >59)

## 2022-04-05 LAB — VITAMIN D 25 HYDROXY (VIT D DEFICIENCY, FRACTURES): Vit D, 25-Hydroxy: 21.4 ng/mL — ABNORMAL LOW (ref 30.0–100.0)

## 2022-04-05 LAB — HEMOGLOBIN A1C
Est. average glucose Bld gHb Est-mCnc: 105 mg/dL
Hgb A1c MFr Bld: 5.3 % (ref 4.8–5.6)

## 2022-04-05 LAB — CBC
Hematocrit: 33.2 % — ABNORMAL LOW (ref 34.0–46.6)
Hemoglobin: 9.6 g/dL — ABNORMAL LOW (ref 11.1–15.9)
MCH: 21.2 pg — ABNORMAL LOW (ref 26.6–33.0)
MCHC: 28.9 g/dL — ABNORMAL LOW (ref 31.5–35.7)
MCV: 73 fL — ABNORMAL LOW (ref 79–97)
Platelets: 360 10*3/uL (ref 150–450)
RBC: 4.53 x10E6/uL (ref 3.77–5.28)
RDW: 15.7 % — ABNORMAL HIGH (ref 11.7–15.4)
WBC: 7.5 10*3/uL (ref 3.4–10.8)

## 2022-04-05 LAB — TSH: TSH: 3.05 u[IU]/mL (ref 0.450–4.500)

## 2022-04-05 LAB — VITAMIN B12: Vitamin B-12: 292 pg/mL (ref 232–1245)

## 2022-04-05 MED ORDER — VITAMIN D (ERGOCALCIFEROL) 1.25 MG (50000 UNIT) PO CAPS: 50000.0000 [IU] | ORAL_CAPSULE | ORAL | 0 refills | Status: DC

## 2022-04-05 MED ORDER — DOCUSATE SODIUM 100 MG PO CAPS: 100.0000 mg | ORAL_CAPSULE | Freq: Two times a day (BID) | ORAL | 2 refills | Status: DC | PRN

## 2022-04-05 MED ORDER — ACCRUFER 30 MG PO CAPS: 1.0000 | ORAL_CAPSULE | Freq: Every day | ORAL | 0 refills | Status: DC

## 2022-05-16 NOTE — Progress Notes (Unsigned)
    GYNECOLOGY PROGRESS NOTE  Subjective:    Patient ID: Deanna Newman, female    DOB: 1986-05-01, 36 y.o.   MRN: [id number]  HPI  Patient is a 36 y.o. [id number] female who presents for evaluation for ovarian pain.  The following portions of the patient's history were reviewed and updated as appropriate: allergies, current medications, past family history, past medical history, past social history, past surgical history, and problem list.  Review of Systems Pertinent items noted in HPI and remainder of comprehensive ROS otherwise negative.   Objective:   currently breastfeeding. There is no height or weight on file to calculate BMI. General appearance: alert, cooperative, and no distress Abdomen: {abdominal exam:16834} Pelvic: {pelvic exam:16852::"cervix normal in appearance","external genitalia normal","no adnexal masses or tenderness","no cervical motion tenderness","rectovaginal septum normal","uterus normal size, shape, and consistency","vagina normal without discharge"} Extremities: {extremity exam:5109} Neurologic: {neuro exam:17854}   Assessment:   No diagnosis found.   Plan:   There are no diagnoses linked to this encounter.     Rubie Maid, MD [facility]

## 2022-05-17 ENCOUNTER — Ambulatory Visit (INDEPENDENT_AMBULATORY_CARE_PROVIDER_SITE_OTHER): Payer: BC Managed Care – PPO | Admitting: Obstetrics and Gynecology

## 2022-05-17 ENCOUNTER — Other Ambulatory Visit (INDEPENDENT_AMBULATORY_CARE_PROVIDER_SITE_OTHER): Payer: BC Managed Care – PPO

## 2022-05-17 ENCOUNTER — Encounter: Payer: Self-pay | Admitting: Obstetrics and Gynecology

## 2022-05-17 VITALS — BP 124/76 | HR 73 | Ht 65.0 in | Wt 248.1 lb

## 2022-05-17 DIAGNOSIS — R102 Pelvic and perineal pain: Secondary | ICD-10-CM | POA: Diagnosis not present

## 2022-05-22 ENCOUNTER — Encounter: Payer: Self-pay | Admitting: Obstetrics and Gynecology

## 2022-06-06 ENCOUNTER — Encounter: Payer: Self-pay | Admitting: Obstetrics and Gynecology

## 2022-08-18 ENCOUNTER — Ambulatory Visit (INDEPENDENT_AMBULATORY_CARE_PROVIDER_SITE_OTHER): Payer: BC Managed Care – PPO | Admitting: Family Medicine

## 2022-08-18 ENCOUNTER — Encounter: Payer: Self-pay | Admitting: Family Medicine

## 2022-08-18 DIAGNOSIS — E559 Vitamin D deficiency, unspecified: Secondary | ICD-10-CM | POA: Insufficient documentation

## 2022-08-18 DIAGNOSIS — D508 Other iron deficiency anemias: Secondary | ICD-10-CM

## 2022-08-18 MED ORDER — SEMAGLUTIDE-WEIGHT MANAGEMENT 0.25 MG/0.5ML ~~LOC~~ SOAJ: 0.2500 mg | SUBCUTANEOUS | 0 refills | Status: DC

## 2022-08-18 MED ORDER — SEMAGLUTIDE-WEIGHT MANAGEMENT 0.5 MG/0.5ML ~~LOC~~ SOAJ: 0.5000 mg | SUBCUTANEOUS | 3 refills | Status: DC

## 2022-08-18 NOTE — Assessment & Plan Note (Signed)
Resume supplementation Recheck level

## 2022-08-18 NOTE — Assessment & Plan Note (Signed)
Not taking iron currently as it caused constipation Recheck CBC and iron supplement Will try different type of iron supplement

## 2022-08-18 NOTE — Progress Notes (Signed)
I,Sulibeya S Dimas,acting as a Neurosurgeon for Shirlee Latch, MD.,have documented all relevant documentation on the behalf of Shirlee Latch, MD,as directed by  Shirlee Latch, MD while in the presence of Shirlee Latch, MD.     Established patient visit   Patient: Deanna Newman   DOB: [birth year]   36 y.o. Female  MRN: [id number] Visit Date: 08/18/2022  Today's healthcare provider: Shirlee Latch, MD   Chief Complaint  Patient presents with   Follow-up   Subjective    HPI  Patient here to go over health, go over vitamin need. She reports she had a physical with Dr. Valentino Saxon GYN.   She is eating healthy and exercising at least 4 times weekly and still not losing weight  Medications: Outpatient Medications Prior to Visit  Medication Sig   Ferric Maltol (ACCRUFER) 30 MG CAPS Take 1 tablet by mouth daily.   Vitamin D, Ergocalciferol, (DRISDOL) 1.25 MG (50000 UNIT) CAPS capsule Take 1 capsule (50,000 Units total) by mouth every 7 (seven) days.   [DISCONTINUED] docusate sodium (COLACE) 100 MG capsule Take 1 capsule (100 mg total) by mouth 2 (two) times daily as needed.   No facility-administered medications prior to visit.    Review of Systems  All other systems reviewed and are negative.      Objective    BP 125/80 (BP Location: Left Arm, Patient Position: Sitting, Cuff Size: Large)   Pulse 81   Temp 98.6 F (37 C) (Oral)   Resp 16   Ht 5\' 5"  (1.651 m)   Wt 255 lb 4.8 oz (115.8 kg)   LMP 07/30/2022 (Exact Date)   BMI 42.48 kg/m    Physical Exam Vitals reviewed.  Constitutional:      General: She is not in acute distress.    Appearance: She is well-developed.  HENT:     Head: Normocephalic and atraumatic.  Eyes:     General: No scleral icterus.    Conjunctiva/sclera: Conjunctivae normal.  Cardiovascular:     Rate and Rhythm: Normal rate and regular rhythm.  Pulmonary:     Effort: Pulmonary effort is normal. No respiratory distress.  Skin:     General: Skin is warm and dry.     Findings: No rash.  Neurological:     Mental Status: She is alert and oriented to person, place, and time.  Psychiatric:        Behavior: Behavior normal.       No results found for any visits on 08/18/22.  Assessment & Plan     Problem List Items Addressed This Visit       Other   Morbid obesity (HCC) - Primary    Discussed importance of healthy weight management Discussed diet and exercise Tried phentermine and lost weight but gained back after stopping  Discussed weight loss options Will try wegovy Discussed possible side effects      Relevant Medications   Semaglutide-Weight Management 0.25 MG/0.5ML SOAJ   Semaglutide-Weight Management 0.5 MG/0.5ML SOAJ (Start on 09/16/2022)   Anemia, iron deficiency    Not taking iron currently as it caused constipation Recheck CBC and iron supplement Will try different type of iron supplement      Relevant Orders   CBC   Iron, TIBC and Ferritin Panel   Vitamin D deficiency    Resume supplementation Recheck level      Relevant Orders   VITAMIN D 25 Hydroxy (Vit-D Deficiency, Fractures)     Return in about 3 months (around  11/17/2022) for weight f/u.      I, Shirlee Latch, MD, have reviewed all documentation for this visit. The documentation on 08/18/22 for the exam, diagnosis, procedures, and orders are all accurate and complete.   Havier Deeb, Marzella Schlein, MD, MPH [facility]

## 2022-08-18 NOTE — Assessment & Plan Note (Signed)
Discussed importance of healthy weight management Discussed diet and exercise Tried phentermine and lost weight but gained back after stopping  Discussed weight loss options Will try wegovy Discussed possible side effects

## 2022-08-19 LAB — VITAMIN D 25 HYDROXY (VIT D DEFICIENCY, FRACTURES): Vit D, 25-Hydroxy: 17.8 ng/mL — ABNORMAL LOW (ref 30.0–100.0)

## 2022-08-19 LAB — IRON,TIBC AND FERRITIN PANEL
Ferritin: 2 ng/mL — ABNORMAL LOW (ref 15–150)
Iron Saturation: 4 % — CL (ref 15–55)
Iron: 14 ug/dL — ABNORMAL LOW (ref 27–159)
Total Iron Binding Capacity: 371 ug/dL (ref 250–450)
UIBC: 357 ug/dL (ref 131–425)

## 2022-08-19 LAB — CBC
Hematocrit: 31.9 % — ABNORMAL LOW (ref 34.0–46.6)
Hemoglobin: 9.2 g/dL — ABNORMAL LOW (ref 11.1–15.9)
MCH: 20.3 pg — ABNORMAL LOW (ref 26.6–33.0)
MCHC: 28.8 g/dL — ABNORMAL LOW (ref 31.5–35.7)
MCV: 70 fL — ABNORMAL LOW (ref 79–97)
Platelets: 401 10*3/uL (ref 150–450)
RBC: 4.54 x10E6/uL (ref 3.77–5.28)
RDW: 15.6 % — ABNORMAL HIGH (ref 11.7–15.4)
WBC: 8.4 10*3/uL (ref 3.4–10.8)

## 2022-08-25 ENCOUNTER — Telehealth: Payer: Self-pay

## 2022-08-25 MED ORDER — VITAMIN D (ERGOCALCIFEROL) 1.25 MG (50000 UNIT) PO CAPS: 50000.0000 [IU] | ORAL_CAPSULE | ORAL | 0 refills | Status: DC

## 2022-08-25 MED ORDER — ALBUTEROL SULFATE HFA 108 (90 BASE) MCG/ACT IN AERS: 2.0000 | INHALATION_SPRAY | Freq: Four times a day (QID) | RESPIRATORY_TRACT | 0 refills | Status: AC | PRN

## 2022-08-25 NOTE — Telephone Encounter (Signed)
-----   Message from Ronnald Ramp, MD sent at 08/24/2022  4:40 PM EST ----- Iron levels are low consistent with longstanding anemia, hemoglobin persistently low.  Patient will benefit from iron supplementation.   Vitamin D levels also low.  Recommend oral supplementation with high-dose weekly Vitamin D.

## 2022-09-01 ENCOUNTER — Encounter: Payer: Self-pay | Admitting: Family Medicine

## 2022-09-07 MED ORDER — SEMAGLUTIDE-WEIGHT MANAGEMENT 0.5 MG/0.5ML ~~LOC~~ SOAJ: 0.5000 mg | SUBCUTANEOUS | 3 refills | Status: DC

## 2022-09-22 ENCOUNTER — Telehealth (INDEPENDENT_AMBULATORY_CARE_PROVIDER_SITE_OTHER): Payer: BC Managed Care – PPO | Admitting: Family Medicine

## 2022-09-22 ENCOUNTER — Encounter: Payer: Self-pay | Admitting: Family Medicine

## 2022-09-22 ENCOUNTER — Telehealth: Payer: Self-pay | Admitting: Family Medicine

## 2022-09-22 DIAGNOSIS — M5442 Lumbago with sciatica, left side: Secondary | ICD-10-CM

## 2022-09-22 MED ORDER — PREDNISONE 10 MG PO TABS: ORAL_TABLET | ORAL | 0 refills | Status: DC

## 2022-09-22 NOTE — Progress Notes (Signed)
I,Sulibeya S Dimas,acting as a Education administrator for Lavon Paganini, MD.,have documented all relevant documentation on the behalf of Lavon Paganini, MD,as directed by  Lavon Paganini, MD while in the presence of Lavon Paganini, MD.   MyChart Video Visit    Virtual Visit via Video Note   This format is felt to be most appropriate for this patient at this time. Physical exam was limited by quality of the video and audio technology used for the visit.    Patient location: home Provider location: home office  Persons involved in the visit: patient, provider  I discussed the limitations of evaluation and management by telemedicine and the availability of in person appointments. The patient expressed understanding and agreed to proceed.  Patient: Deanna Newman   DOB: October 28, 1985   37 y.o. Female  MRN: [id number] Visit Date: 09/22/2022  Today's healthcare provider: Lavon Paganini, MD   Chief Complaint  Patient presents with   Back Pain   Subjective    Back Pain This is a recurrent problem. The problem occurs daily. The problem has been gradually worsening since onset. Pain location: left lower back. The quality of the pain is described as aching and stabbing. The pain radiates to the left thigh. The pain is moderate. The pain is The same all the time. Associated symptoms include tingling. Pertinent negatives include no bladder incontinence, dysuria, numbness or weakness. She has tried NSAIDs for the symptoms. The treatment provided no relief.   Started in October and thought it was related to weight lifting and then possibly period. Got worse in the last week. Stopped going to the gym this week. Better with heat. More achy when cold.   Medications: Outpatient Medications Prior to Visit  Medication Sig   Vitamin D, Ergocalciferol, (DRISDOL) 1.25 MG (50000 UNIT) CAPS capsule Take 1 capsule (50,000 Units total) by mouth every 7 (seven) days.   albuterol (VENTOLIN HFA) 108 (90 Base)  MCG/ACT inhaler Inhale 2 puffs into the lungs every 6 (six) hours as needed for wheezing or shortness of breath.   Semaglutide-Weight Management 0.5 MG/0.5ML SOAJ Inject 0.5 mg into the skin once a week. (Patient not taking: Reported on 09/22/2022)   No facility-administered medications prior to visit.    Review of Systems  Genitourinary:  Negative for bladder incontinence and dysuria.  Musculoskeletal:  Positive for back pain.  Neurological:  Positive for tingling. Negative for weakness and numbness.       Objective    There were no vitals taken for this visit.     Physical Exam Constitutional:      General: She is not in acute distress.    Appearance: Normal appearance.  HENT:     Head: Normocephalic.  Pulmonary:     Effort: Pulmonary effort is normal. No respiratory distress.  Neurological:     Mental Status: She is alert and oriented to person, place, and time. Mental status is at baseline.        Assessment & Plan     1. Acute left-sided low back pain with left-sided sciatica - symptoms c/w sciatica - discussed rest, ice/heat - no need for imaging at this time - no red flags - discussed treatment options - will do prednisone taper at this time - return precautions discussed  Meds ordered this encounter  Medications   predniSONE (DELTASONE) 10 MG tablet    Sig: Take 60mg  PO daily x2d, then 50mg  daily x2d, then 40mg  daily x2d, then 30mg  daily x2d, then 20mg  daily x2d,  then 10mg  daily x2d, then stop    Dispense:  42 tablet    Refill:  0     Return if symptoms worsen or fail to improve.     I discussed the assessment and treatment plan with the patient. The patient was provided an opportunity to ask questions and all were answered. The patient agreed with the plan and demonstrated an understanding of the instructions.   The patient was advised to call back or seek an in-person evaluation if the symptoms worsen or if the condition fails to improve as  anticipated.  I, Lavon Paganini, MD, have reviewed all documentation for this visit. The documentation on 09/22/22 for the exam, diagnosis, procedures, and orders are all accurate and complete.   Anya Murphey, Dionne Bucy, MD, MPH [facility]

## 2022-09-22 NOTE — Telephone Encounter (Signed)
The patient states that even with a coupon and insurance the medication is still $1,000 and just too expensive for her at this time. Do you know of any other alternatives that maybe cheaper and covered by her insurance? She did go pick up her medicine that was prescribed today by her provider. Please assist patient further.

## 2022-09-22 NOTE — Telephone Encounter (Signed)
PA attempted. I got this message from plan: Your PA has been resolved, no additional PA is required. I called [facility] Val states that medication Mancel Parsons is on back order. And she is not able to check if PA is needed for medication. I left a voice mail advising patient to check with other pharmacy if she is still interested in getting medication.

## 2022-09-22 NOTE — Telephone Encounter (Signed)
We received a fax from covermymeds for wegovy 0.5g/0.24ml auto-injectors  Key: [facility]

## 2022-09-25 NOTE — Telephone Encounter (Signed)
Unfortunately, the state health plan just stopped covering weight loss meds.  The only affordable option without insurance coverage is phentermine. It is a daily pill (stimulant) that suppresses appetite. Can only be used for 3 months, but can be a jump start for some people. I do see weight gain after stopping it often though.

## 2022-09-26 MED ORDER — PHENTERMINE HCL 37.5 MG PO CAPS: 37.5000 mg | ORAL_CAPSULE | ORAL | 2 refills | Status: DC

## 2022-09-26 NOTE — Telephone Encounter (Signed)
Rx sent 

## 2022-09-26 NOTE — Telephone Encounter (Signed)
Patient advised.

## 2022-10-30 ENCOUNTER — Telehealth: Payer: Self-pay

## 2022-10-30 NOTE — Transitions of Care (Post Inpatient/ED Visit) (Signed)
   10/30/2022  Name: Deanna Newman MRN: [id number] DOB: [birth year]  Today's TOC FU Call Status: Today's TOC FU Call Status:: Successful TOC FU Call Competed  Transition Care Management Follow-up Telephone Call Type of Discharge: Emergency Department How have you been since you were released from the hospital?: Better Any questions or concerns?: No  Items Reviewed: Did you receive and understand the discharge instructions provided?: Yes Medications obtained and verified?: Yes (Medications Reviewed) Any new allergies since your discharge?: No Dietary orders reviewed?: No Do you have support at home?: Yes People in Home: spouse, parent(s), sibling(s)  Home Care and Equipment/Supplies: [place] Ordered?: No Any new equipment or medical supplies ordered?: No  Functional Questionnaire: Do you need assistance with bathing/showering or dressing?: No Do you need assistance with meal preparation?: No Do you need assistance with eating?: No Do you have difficulty maintaining continence: No Do you need assistance with getting out of bed/getting out of a chair/moving?: No Do you have difficulty managing or taking your medications?: No  Folllow up appointments reviewed: PCP Follow-up appointment confirmed?: Yes Date of PCP follow-up appointment?: 11/30/22 Follow-up Provider: [facility] Follow-up appointment confirmed?: Yes Date of Specialist follow-up appointment?: 11/13/22 Follow-Up Specialty Provider:: Dr. Narda Bonds Do you need transportation to your follow-up appointment?: No Do you understand care options if your condition(s) worsen?: Yes-patient verbalized understanding    [place], [place]

## 2022-11-18 ENCOUNTER — Other Ambulatory Visit: Payer: Self-pay | Admitting: Family Medicine

## 2022-11-20 ENCOUNTER — Ambulatory Visit: Payer: BC Managed Care – PPO | Admitting: Family Medicine

## 2022-11-29 NOTE — Progress Notes (Unsigned)
   I,Sulibeya S Dimas,acting as a Neurosurgeon for Shirlee Latch, MD.,have documented all relevant documentation on the behalf of Shirlee Latch, MD,as directed by  Shirlee Latch, MD while in the presence of Shirlee Latch, MD.     Established patient visit   Patient: Deanna Newman   DOB: 08/28/1986   37 y.o. Female  MRN: [id number] Visit Date: 11/30/2022  Today's healthcare provider: Shirlee Latch, MD   No chief complaint on file.  Subjective    HPI  Follow up for weight management  The patient was last seen for this 3 months ago. Changes made at last visit include try wegovy, not covered by insurance. Start phentermine 37.5 mg daily.  She reports {excellent/good/fair/poor:19665} compliance with treatment. She feels that condition is {improved/worse/unchanged:3041574}. She {is/is not:21021397} having side effects. ***  Wt Readings from Last 3 Encounters:  08/18/22 255 lb 4.8 oz (115.8 kg)  05/17/22 248 lb 1.6 oz (112.5 kg)  04/04/22 239 lb 9.6 oz (108.7 kg)   -----------------------------------------------------------------------------------------   Medications: Outpatient Medications Prior to Visit  Medication Sig   albuterol (VENTOLIN HFA) 108 (90 Base) MCG/ACT inhaler Inhale 2 puffs into the lungs every 6 (six) hours as needed for wheezing or shortness of breath.   omeprazole (PRILOSEC) 20 MG capsule Take 20 mg by mouth daily.   ondansetron (ZOFRAN-ODT) 4 MG disintegrating tablet Take 4 mg by mouth every 8 (eight) hours as needed.   phentermine 37.5 MG capsule Take 1 capsule (37.5 mg total) by mouth every morning.   predniSONE (DELTASONE) 10 MG tablet Take 60mg  PO daily x2d, then 50mg  daily x2d, then 40mg  daily x2d, then 30mg  daily x2d, then 20mg  daily x2d, then 10mg  daily x2d, then stop (Patient not taking: Reported on 10/30/2022)   Semaglutide-Weight Management 0.5 MG/0.5ML SOAJ Inject 0.5 mg into the skin once a week. (Patient not taking: Reported on  09/22/2022)   sucralfate (CARAFATE) 1 g tablet Take 1 g by mouth 4 (four) times daily.   Vitamin D, Ergocalciferol, (DRISDOL) 1.25 MG (50000 UNIT) CAPS capsule Take 1 capsule (50,000 Units total) by mouth every 7 (seven) days.   No facility-administered medications prior to visit.    Review of Systems  {Labs  Heme  Chem  Endocrine  Serology  Results Review (optional):23779}   Objective    There were no vitals taken for this visit. {Show previous vital signs (optional):23777}  Physical Exam  ***  No results found for any visits on 11/30/22.  Assessment & Plan     ***  No follow-ups on file.      {provider attestation***:1}   Shirlee Latch, MD  [facility] [phone] (phone) [phone] (fax)  [facility]

## 2022-11-30 ENCOUNTER — Ambulatory Visit (INDEPENDENT_AMBULATORY_CARE_PROVIDER_SITE_OTHER): Payer: BC Managed Care – PPO | Admitting: Family Medicine

## 2022-11-30 ENCOUNTER — Encounter: Payer: Self-pay | Admitting: Family Medicine

## 2022-11-30 DIAGNOSIS — K219 Gastro-esophageal reflux disease without esophagitis: Secondary | ICD-10-CM | POA: Diagnosis not present

## 2022-11-30 DIAGNOSIS — E559 Vitamin D deficiency, unspecified: Secondary | ICD-10-CM

## 2022-11-30 DIAGNOSIS — N926 Irregular menstruation, unspecified: Secondary | ICD-10-CM | POA: Diagnosis not present

## 2022-11-30 DIAGNOSIS — D508 Other iron deficiency anemias: Secondary | ICD-10-CM

## 2022-11-30 NOTE — Assessment & Plan Note (Signed)
Given patient's history of irregular menses, negative ROS, and physical exam, low suspicion of a pathological process.  Due to repeated negative pregnancy tests, unlikely pregnancy. Patient told she can contact the office if she becomes concerned or if she would like an in office pregnancy test.

## 2022-11-30 NOTE — Assessment & Plan Note (Signed)
Chronic and uncontrolled.  Ordered Iron and CBC.  Continue slow release iron supplements.

## 2022-11-30 NOTE — Assessment & Plan Note (Addendum)
Has gained 2 lbs in the last 3 months.  Patient reassured that a weight plateau is normal during weight loss and to continue her lifestyle changes.  Continue phentermine 37.5 mg daily, but hold on the days surrounding her cholecystectomy.  Trace edema likely due to the phentermine or extended time periods spent stationary at work.

## 2022-11-30 NOTE — Progress Notes (Signed)
Established Patient Office Visit  Subjective   Patient ID: Deanna Newman, female    DOB: October 04, 1985  Age: 37 y.o. MRN: [id number]  Chief Complaint  Patient presents with   Obesity    Patient was last seen 3 months ago for weight loss management and is in clinic today for follow up. Medication changes at last visit: Started on Semaglutide 0.5 mg weekly   Weight Loss Stopped semaglutide due to cost.  Has been taking phentermine.  Weight has hit a plateau but has seen changes in body shape.  Is going to cross-fit at least three times a week.  Is eating a restrictive diet due to fear of gallbladder pain.  Has noticed some lower leg swelling recently.  Vitamin D deficiency Taking vitamin D supplements.  Wants to know if she needs to continue them.   Iron deficiency anemia Switched to slow release iron supplements  Constipation has resolved since switching  Feels fatigued but contributes that to giving up coffee for [place].   GERD Recent ED visit due to gallbladder pain and GERD.  Is scheduled for cholecystectomy on April 23rd.  Taking Omeprazole and reports it as very effective at controlling her GERD Only takes her Carafate in the morning, is supposed to take it 4 times daily but forgets.  Eating a restrictive diet to prevent another attack before surgery.   Irregular Menses Last period in February.  Has had multiple negative pregnancy tests in the past month. Both at home and in the ED.  Does not have any concerns of being pregnant today.          Review of Systems  Constitutional:  Positive for malaise/fatigue. Negative for chills and fever.  Respiratory:  Negative for shortness of breath.   Cardiovascular:  Negative for chest pain.  Gastrointestinal:  Negative for abdominal pain, constipation, diarrhea, heartburn and vomiting.  Genitourinary:  Negative for dysuria.      Objective:     BP 120/84 (BP Location: Left Arm, Patient Position: Sitting, Cuff Size:  Large)   Pulse 85   Temp 98.4 F (36.9 C) (Temporal)   Resp 12   Wt 257 lb 4.8 oz (116.7 kg)   LMP 09/28/2022 (Exact Date)   BMI 42.82 kg/m    Physical Exam Cardiovascular:     Pulses: Normal pulses.     Heart sounds: Normal heart sounds.  Pulmonary:     Effort: Pulmonary effort is normal.     Breath sounds: Normal breath sounds.  Abdominal:     General: There is no distension.     Palpations: Abdomen is soft. There is no mass.     Tenderness: There is no abdominal tenderness.  Musculoskeletal:     Comments: Trace non-pitting edema on bilateral lower legs.    Skin:    General: Skin is warm and dry.  Neurological:     Mental Status: She is alert.      No results found for any visits on 11/30/22.    The ASCVD Risk score (Arnett DK, et al., 2019) failed to calculate for the following reasons:   The 2019 ASCVD risk score is only valid for ages 58 to 87    Assessment & Plan:   Problem List Items Addressed This Visit     Morbid obesity - Primary    Chronic and uncontrolled.  Patient reassured that a weight plateau is normal during weight loss and to continue her lifestyle changes.  Continue phentermine 37.5 mg daily, but  hold on the days surrounding her cholecystectomy.  Trace edema likely due to the phentermine or extended time periods spent stationary at work.       Irregular menses    Given patient's history of irregular menses, negative ROS, and physical exam, low suspicion of a pathological process.  Due to repeated negative pregnancy tests, unlikely pregnancy. Patient told she can contact the office if she becomes concerned or if she would like an in office pregnancy test.       Anemia, iron deficiency    Chronic and uncontrolled.  Ordered Iron and CBC.  Continue slow release iron supplements.       Relevant Orders   CBC   Iron, TIBC and Ferritin Panel   Gastroesophageal reflux disease without esophagitis    Chronic and well controlled.  Continue  omeprazole 20 mg daily.  Cholecystectomy scheduled for April 23rd.   Patient advised to avoid high fat foods until surgery.      Vitamin D deficiency    Chronic and uncontrolled.  Ordered vitamin D.  Continue vitamin D supplements.        Relevant Orders   VITAMIN D 25 Hydroxy (Vit-D Deficiency, Fractures)    Return in about 3 months (around 03/01/2023) for CPE.    Wynona Dove, Medical Student  Patient seen along with MS3 student Everlene Balls. I personally evaluated this patient along with the student, and verified all aspects of the history, physical exam, and medical decision making as documented by the student. I agree with the student's documentation and have made all necessary edits.  Gladyes Kudo, Dionne Bucy, MD, MPH [facility]

## 2022-11-30 NOTE — Assessment & Plan Note (Signed)
Chronic and uncontrolled.  Ordered vitamin D.  Continue vitamin D supplements.

## 2022-11-30 NOTE — Assessment & Plan Note (Signed)
Chronic and well controlled.  Continue omeprazole 20 mg daily.  Cholecystectomy scheduled for April 23rd.   Patient advised to avoid high fat foods until surgery.

## 2022-12-01 LAB — CBC
Hematocrit: 33.4 % — ABNORMAL LOW (ref 34.0–46.6)
Hemoglobin: 9.8 g/dL — ABNORMAL LOW (ref 11.1–15.9)
MCH: 21 pg — ABNORMAL LOW (ref 26.6–33.0)
MCHC: 29.3 g/dL — ABNORMAL LOW (ref 31.5–35.7)
MCV: 72 fL — ABNORMAL LOW (ref 79–97)
Platelets: 459 10*3/uL — ABNORMAL HIGH (ref 150–450)
RBC: 4.67 x10E6/uL (ref 3.77–5.28)
RDW: 15.2 % (ref 11.7–15.4)
WBC: 7.6 10*3/uL (ref 3.4–10.8)

## 2022-12-01 LAB — IRON,TIBC AND FERRITIN PANEL
Ferritin: 5 ng/mL — ABNORMAL LOW (ref 15–150)
Iron Saturation: 14 % — ABNORMAL LOW (ref 15–55)
Iron: 57 ug/dL (ref 27–159)
Total Iron Binding Capacity: 398 ug/dL (ref 250–450)
UIBC: 341 ug/dL (ref 131–425)

## 2022-12-01 LAB — VITAMIN D 25 HYDROXY (VIT D DEFICIENCY, FRACTURES): Vit D, 25-Hydroxy: 41.9 ng/mL (ref 30.0–100.0)

## 2023-03-06 ENCOUNTER — Ambulatory Visit (INDEPENDENT_AMBULATORY_CARE_PROVIDER_SITE_OTHER): Payer: BC Managed Care – PPO | Admitting: Family Medicine

## 2023-03-06 ENCOUNTER — Encounter: Payer: Self-pay | Admitting: Family Medicine

## 2023-03-06 VITALS — BP 126/81 | HR 76 | Temp 98.2°F | Resp 12 | Ht 66.0 in | Wt 264.8 lb

## 2023-03-06 DIAGNOSIS — M25562 Pain in left knee: Secondary | ICD-10-CM

## 2023-03-06 DIAGNOSIS — D508 Other iron deficiency anemias: Secondary | ICD-10-CM | POA: Diagnosis not present

## 2023-03-06 DIAGNOSIS — E559 Vitamin D deficiency, unspecified: Secondary | ICD-10-CM

## 2023-03-06 DIAGNOSIS — Z0000 Encounter for general adult medical examination without abnormal findings: Secondary | ICD-10-CM

## 2023-03-06 DIAGNOSIS — Z6841 Body Mass Index (BMI) 40.0 and over, adult: Secondary | ICD-10-CM

## 2023-03-06 NOTE — Progress Notes (Signed)
I,Sulibeya S Dimas,acting as a Neurosurgeon for Shirlee Latch, MD.,have documented all relevant documentation on the behalf of Shirlee Latch, MD,as directed by  Shirlee Latch, MD while in the presence of Shirlee Latch, MD.    Complete physical exam   Patient: Deanna Newman   DOB: 1986/04/30   37 y.o. Female  MRN: [id number] Visit Date: 03/06/2023  Today's healthcare provider: Shirlee Latch, MD   Chief Complaint  Patient presents with   Annual Exam   Subjective    Deanna Newman is a 37 y.o. female who presents today for a complete physical exam.  She reports consuming a general diet. Gym/ health club routine includes mod to heavy weightlifting. She generally feels fairly well. She reports sleeping well. She does not have additional problems to discuss today.  HPI  Discussed the use of AI scribe software for clinical note transcription with the patient, who gave verbal consent to proceed.  History of Present Illness   The patient, with a history of gallbladder surgery, presents for a physical examination. She expresses ongoing concern about her weight. She also reports a recent knee injury sustained during a fall, which has been improving but still causes pain, particularly when bending the knee. The patient suspects the pain may be due to swelling. She has resumed gym activities following recovery from gallbladder surgery, which she reports went well. The patient also expresses concern about a potential genetic predisposition to cancer, as both her grandmother and uncle have had cancer that spread throughout their bodies. She is unsure of the type of cancer her relatives had. She also reports feeling excessively tired, suspecting that her vitamin D levels may be low. She has not been taking her vitamin D supplement regularly. The patient also mentions experiencing blurry vision at night, particularly when driving, despite being told by an ophthalmologist that her eyes are  fine.       Past Medical History:  Diagnosis Date   Anemia    Asthma    Cataract    Gestational hypertension    Migraine    Vaginal Pap smear, abnormal 2010   followed by colposcopy   Past Surgical History:  Procedure Laterality Date   CESAREAN SECTION     CESAREAN SECTION N/A 07/31/2018   Procedure: REPEAT CESAREAN SECTION;  Surgeon: Hildred Laser, MD;  Location: [facility] ORS;  Service: Obstetrics;  Laterality: N/A;  Time of Birth; [birth year] Sex: Female Wt: 8 LB 5 oz   CHOLECYSTECTOMY  12/19/2022   WISDOM TOOTH EXTRACTION     Social History   Socioeconomic History   Marital status: Married    Spouse name: Not on file   Number of children: 1   Years of education: Not on file   Highest education level: Associate degree: academic program  Occupational History   Occupation: Admin Specialist  Tobacco Use   Smoking status: Never   Smokeless tobacco: Never  Vaping Use   Vaping Use: Never used  Substance and Sexual Activity   Alcohol use: Yes    Alcohol/week: 2.0 standard drinks of alcohol    Types: 2 Cans of beer per week    Comment: Occasionally    Drug use: No   Sexual activity: Yes    Birth control/protection: Other-see comments, None  Other Topics Concern   Not on file  Social History Narrative   Not on file   Social Determinants of Health   Financial Resource Strain: Low Risk  (11/29/2022)   Overall Physicist, medical Strain (  CARDIA)    Difficulty of Paying Living Expenses: Not very hard  Food Insecurity: No Food Insecurity (11/29/2022)   Hunger Vital Sign    Worried About Running Out of Food in the Last Year: Never true    Ran Out of Food in the Last Year: Never true  Transportation Needs: No Transportation Needs (11/29/2022)   PRAPARE - Administrator, Civil Service (Medical): No    Lack of Transportation (Non-Medical): No  Physical Activity: Sufficiently Active (11/29/2022)   Exercise Vital Sign    Days of Exercise per Week: 3 days    Minutes of Exercise  per Session: 60 min  Stress: No Stress Concern Present (11/29/2022)   [employer] of Occupational Health - Occupational Stress Questionnaire    Feeling of Stress : Not at all  Social Connections: Moderately Integrated (11/29/2022)   Social Connection and Isolation Panel [NHANES]    Frequency of Communication with Friends and Family: Three times a week    Frequency of Social Gatherings with Friends and Family: Once a week    Attends Religious Services: More than 4 times per year    Active Member of [employer] or Organizations: No    Attends Banker Meetings: Never    Marital Status: Married  Catering manager Violence: Not At Risk (10/30/2022)   Humiliation, Afraid, Rape, and Kick questionnaire    Fear of Current or Ex-Partner: No    Emotionally Abused: No    Physically Abused: No    Sexually Abused: No   Family Status  Relation Name Status   Mother  Alive   Father Rae Mar Alive   Sister Kerry Kass Alive   PGM Besty [place] Deceased   MGM  (Not Specified)   Brother  Alive   Family History  Problem Relation Age of Onset   Skin cancer Mother    GER disease Mother    Sleep apnea Mother    Diabetes Father    COPD Father        smoker   Asthma Father    Hypertension Father    Heart failure Father    Sleep apnea Father    Hypertension Sister    Anxiety disorder Sister    Obesity Sister    COPD Paternal Grandmother    Heart disease Paternal Grandmother    Diabetes Paternal Grandmother    Lung cancer Paternal Grandmother        smoker   Skin cancer Maternal Grandmother    Healthy Brother    Allergies  Allergen Reactions   Fentanyl Itching    Makes pt want to scratch skin off    Sumatriptan Anaphylaxis    Patient Care Team: Erasmo Downer, MD as PCP - General (Family Medicine)   Medications: Outpatient Medications Prior to Visit  Medication Sig   albuterol (VENTOLIN HFA) 108 (90 Base) MCG/ACT inhaler Inhale 2 puffs into the lungs every 6  (six) hours as needed for wheezing or shortness of breath.   Vitamin D, Ergocalciferol, (DRISDOL) 1.25 MG (50000 UNIT) CAPS capsule Take 1 capsule (50,000 Units total) by mouth every 7 (seven) days. (Patient not taking: Reported on 03/06/2023)   [DISCONTINUED] omeprazole (PRILOSEC) 20 MG capsule Take 20 mg by mouth daily.   [DISCONTINUED] ondansetron (ZOFRAN-ODT) 4 MG disintegrating tablet Take 4 mg by mouth every 8 (eight) hours as needed.   [DISCONTINUED] phentermine 37.5 MG capsule Take 1 capsule (37.5 mg total) by mouth every morning.   [DISCONTINUED] sucralfate (CARAFATE) 1  g tablet Take 1 g by mouth 4 (four) times daily.   No facility-administered medications prior to visit.    Review of Systems  All other systems reviewed and are negative.   Last CBC Lab Results  Component Value Date   WBC 7.6 11/30/2022   HGB 9.8 (L) 11/30/2022   HCT 33.4 (L) 11/30/2022   MCV 72 (L) 11/30/2022   MCH 21.0 (L) 11/30/2022   RDW 15.2 11/30/2022   PLT 459 (H) 11/30/2022   Last metabolic panel Lab Results  Component Value Date   GLUCOSE 76 04/04/2022   NA 138 04/04/2022   K 4.4 04/04/2022   CL 105 04/04/2022   CO2 23 04/04/2022   BUN 11 04/04/2022   CREATININE 0.62 04/04/2022   EGFR 120 04/04/2022   CALCIUM 8.9 04/04/2022   PROT 6.8 04/04/2022   ALBUMIN 4.3 04/04/2022   LABGLOB 2.5 04/04/2022   AGRATIO 1.7 04/04/2022   BILITOT 0.2 04/04/2022   ALKPHOS 72 04/04/2022   AST 11 04/04/2022   ALT 8 04/04/2022   ANIONGAP 2 (L) 07/02/2013   Last lipids Lab Results  Component Value Date   CHOL 154 09/06/2021   HDL 34 (L) 09/06/2021   LDLCALC 95 09/06/2021   TRIG 141 09/06/2021   CHOLHDL 4.5 (H) 09/06/2021   Last hemoglobin A1c Lab Results  Component Value Date   HGBA1C 5.3 04/04/2022   Last thyroid functions Lab Results  Component Value Date   TSH 3.050 04/04/2022      Objective    BP 126/81 (BP Location: Left Arm, Patient Position: Sitting, Cuff Size: Large)   Pulse 76    Temp 98.2 F (36.8 C) (Temporal)   Resp 12   Ht 5\' 6"  (1.676 m)   Wt 264 lb 12.8 oz (120.1 kg)   LMP 02/24/2023 (Exact Date)   SpO2 98%   BMI 42.74 kg/m  BP Readings from Last 3 Encounters:  03/06/23 126/81  11/30/22 120/84  08/18/22 125/80   Wt Readings from Last 3 Encounters:  03/06/23 264 lb 12.8 oz (120.1 kg)  11/30/22 257 lb 4.8 oz (116.7 kg)  08/18/22 255 lb 4.8 oz (115.8 kg)       Physical Exam Vitals reviewed.  Constitutional:      General: She is not in acute distress.    Appearance: Normal appearance. She is well-developed. She is not diaphoretic.  HENT:     Head: Normocephalic and atraumatic.     Right Ear: Tympanic membrane, ear canal and external ear normal.     Left Ear: Tympanic membrane, ear canal and external ear normal.     Nose: Nose normal.     Mouth/Throat:     Mouth: Mucous membranes are moist.     Pharynx: Oropharynx is clear. No oropharyngeal exudate.  Eyes:     General: No scleral icterus.    Conjunctiva/sclera: Conjunctivae normal.     Pupils: Pupils are equal, round, and reactive to light.  Neck:     Thyroid: No thyromegaly.  Cardiovascular:     Rate and Rhythm: Normal rate and regular rhythm.     Heart sounds: Normal heart sounds. No murmur heard. Pulmonary:     Effort: Pulmonary effort is normal. No respiratory distress.     Breath sounds: Normal breath sounds. No wheezing or rales.  Abdominal:     General: There is no distension.     Palpations: Abdomen is soft.     Tenderness: There is no abdominal tenderness.  Musculoskeletal:  General: No deformity.     Cervical back: Neck supple.     Right lower leg: No edema.     Left lower leg: No edema.  Lymphadenopathy:     Cervical: No cervical adenopathy.  Skin:    General: Skin is warm and dry.     Findings: No rash.  Neurological:     Mental Status: She is alert and oriented to person, place, and time. Mental status is at baseline.     Gait: Gait normal.  Psychiatric:         Mood and Affect: Mood normal.        Behavior: Behavior normal.        Thought Content: Thought content normal.     Physical Exam   EXTREMITIES: Left knee exhibits tenderness on lateral aspect, suggestive of a bone bruise.       Last depression screening scores    03/06/2023    8:38 AM 11/30/2022    8:11 AM 08/18/2022    8:11 AM  PHQ 2/9 Scores  PHQ - 2 Score 1 0 0  PHQ- 9 Score 6 4 5    Last fall risk screening    03/06/2023    8:38 AM  Fall Risk   Falls in the past year? 1  Number falls in past yr: 0  Injury with Fall? 1  Risk for fall due to : No Fall Risks  Follow up Falls evaluation completed   Last Audit-C alcohol use screening    11/30/2022    8:11 AM  Alcohol Use Disorder Test (AUDIT)  1. How often do you have a drink containing alcohol? 2  2. How many drinks containing alcohol do you have on a typical day when you are drinking? 0  3. How often do you have six or more drinks on one occasion? 0  AUDIT-C Score 2   A score of 3 or more in women, and 4 or more in men indicates increased risk for alcohol abuse, EXCEPT if all of the points are from question 1   No results found for any visits on 03/06/23.  Assessment & Plan    Routine Health Maintenance and Physical Exam  Exercise Activities and Dietary recommendations  Goals   None     Immunization History  Administered Date(s) Administered   HPV Quadrivalent 08/23/2010   Influenza, Seasonal, Injecte, Preservative Fre 05/24/2012   Influenza,inj,Quad PF,6+ Mos 05/14/2018, 05/28/2022   Influenza-Unspecified 05/14/2018, 06/11/2020, 06/26/2021   PFIZER(Purple Top)SARS-COV-2 Vaccination 12/06/2019, 12/30/2019   Tdap 01/01/2012, 05/14/2018    Health Maintenance  Topic Date Due   HPV VACCINES (2 - 3-dose series) 09/20/2010   COVID-19 Vaccine (3 - 2023-24 season) 04/28/2022   INFLUENZA VACCINE  03/29/2023   PAP SMEAR-Modifier  03/24/2025   DTaP/Tdap/Td (3 - Td or Tdap) 05/14/2028   Hepatitis C Screening   Completed   HIV Screening  Completed    Discussed health benefits of physical activity, and encouraged her to engage in regular exercise appropriate for her age and condition.  Problem List Items Addressed This Visit       Other   Morbid obesity (HCC)    Discussed importance of healthy weight management Discussed diet and exercise      Relevant Orders   TSH   Iron, TIBC and Ferritin Panel   CBC with Differential/Platelet   Comprehensive metabolic panel   Lipid panel   VITAMIN D 25 Hydroxy (Vit-D Deficiency, Fractures)   Anemia, iron deficiency   Relevant  Orders   Iron, TIBC and Ferritin Panel   CBC with Differential/Platelet   Vitamin D deficiency   Relevant Orders   VITAMIN D 25 Hydroxy (Vit-D Deficiency, Fractures)   Other Visit Diagnoses     Encounter for annual physical exam    -  Primary   Relevant Orders   TSH   Iron, TIBC and Ferritin Panel   CBC with Differential/Platelet   Comprehensive metabolic panel   Lipid panel   VITAMIN D 25 Hydroxy (Vit-D Deficiency, Fractures)          Left Knee Injury: Sustained injury on June 8th, 2024. No immediate medical attention sought. Pain and swelling noted, but improving. Likely bone bruise given the clinical course. -Continue to monitor and allow for natural healing.  Weight Management: Patient expressed concern about weight. -Encourage regular exercise and healthy diet.  Family History of Cancer: Patient's uncle and grandmother had cancer. Unclear type and origin. Patient concerned about genetic predisposition. -Advise patient to obtain more information about the type of cancer in the family. Consider referral to genetic counseling if a genetic mutation is identified in the uncle.  Vitamin D Deficiency: Patient reports feeling tired and has not been taking Vitamin D supplement. -Order labs to check Vitamin D, thyroid, iron, blood counts, kidney and liver function, blood sugar, and cholesterol. -Advise patient to  restart Vitamin D supplement if levels are low.  General Health Maintenance: -Continue current immunization schedule. Flu shot due in the fall. -Pap smear due in 2026. -Consider eye examination for blurry vision at night.        Return in about 1 year (around 03/05/2024) for CPE.     I, Shirlee Latch, MD, have reviewed all documentation for this visit. The documentation on 03/06/23 for the exam, diagnosis, procedures, and orders are all accurate and complete.   Jonathon Castelo, Marzella Schlein, MD, MPH [facility]

## 2023-03-06 NOTE — Assessment & Plan Note (Signed)
Discussed importance of healthy weight management Discussed diet and exercise  

## 2023-03-07 LAB — CBC WITH DIFFERENTIAL/PLATELET
Basophils Absolute: 0 10*3/uL (ref 0.0–0.2)
Basos: 1 %
EOS (ABSOLUTE): 0.2 10*3/uL (ref 0.0–0.4)
Eos: 3 %
Hematocrit: 33.3 % — ABNORMAL LOW (ref 34.0–46.6)
Hemoglobin: 10.1 g/dL — ABNORMAL LOW (ref 11.1–15.9)
Immature Grans (Abs): 0 10*3/uL (ref 0.0–0.1)
Immature Granulocytes: 0 %
Lymphocytes Absolute: 2.6 10*3/uL (ref 0.7–3.1)
Lymphs: 38 %
MCH: 21.4 pg — ABNORMAL LOW (ref 26.6–33.0)
MCHC: 30.3 g/dL — ABNORMAL LOW (ref 31.5–35.7)
MCV: 71 fL — ABNORMAL LOW (ref 79–97)
Monocytes Absolute: 0.5 10*3/uL (ref 0.1–0.9)
Monocytes: 8 %
Neutrophils Absolute: 3.4 10*3/uL (ref 1.4–7.0)
Neutrophils: 50 %
Platelets: 449 10*3/uL (ref 150–450)
RBC: 4.72 x10E6/uL (ref 3.77–5.28)
RDW: 15.9 % — ABNORMAL HIGH (ref 11.7–15.4)
WBC: 6.8 10*3/uL (ref 3.4–10.8)

## 2023-03-07 LAB — COMPREHENSIVE METABOLIC PANEL
ALT: 13 IU/L (ref 0–32)
AST: 16 IU/L (ref 0–40)
Albumin: 4.1 g/dL (ref 3.9–4.9)
Alkaline Phosphatase: 87 IU/L (ref 44–121)
BUN/Creatinine Ratio: 13 (ref 9–23)
BUN: 9 mg/dL (ref 6–20)
Bilirubin Total: <0.2 mg/dL (ref 0.0–1.2)
CO2: 22 mmol/L (ref 20–29)
Calcium: 9.5 mg/dL (ref 8.7–10.2)
Chloride: 105 mmol/L (ref 96–106)
Creatinine, Ser: 0.72 mg/dL (ref 0.57–1.00)
Globulin, Total: 2.8 g/dL (ref 1.5–4.5)
Glucose: 95 mg/dL (ref 70–99)
Potassium: 4.5 mmol/L (ref 3.5–5.2)
Sodium: 142 mmol/L (ref 134–144)
Total Protein: 6.9 g/dL (ref 6.0–8.5)
eGFR: 112 mL/min/{1.73_m2} (ref >59)

## 2023-03-07 LAB — IRON,TIBC AND FERRITIN PANEL
Ferritin: 3 ng/mL — ABNORMAL LOW (ref 15–150)
Iron Saturation: 4 % — CL (ref 15–55)
Iron: 15 ug/dL — ABNORMAL LOW (ref 27–159)
Total Iron Binding Capacity: 386 ug/dL (ref 250–450)
UIBC: 371 ug/dL (ref 131–425)

## 2023-03-07 LAB — LIPID PANEL
Chol/HDL Ratio: 3.6 ratio (ref 0.0–4.4)
Cholesterol, Total: 161 mg/dL (ref 100–199)
HDL: 45 mg/dL (ref >39)
LDL Chol Calc (NIH): 95 mg/dL (ref 0–99)
Triglycerides: 119 mg/dL (ref 0–149)
VLDL Cholesterol Cal: 21 mg/dL (ref 5–40)

## 2023-03-07 LAB — VITAMIN D 25 HYDROXY (VIT D DEFICIENCY, FRACTURES): Vit D, 25-Hydroxy: 27.7 ng/mL — ABNORMAL LOW (ref 30.0–100.0)

## 2023-03-07 LAB — TSH: TSH: 1.84 u[IU]/mL (ref 0.450–4.500)

## 2023-11-06 ENCOUNTER — Telehealth (INDEPENDENT_AMBULATORY_CARE_PROVIDER_SITE_OTHER): Payer: PRIVATE HEALTH INSURANCE | Admitting: Family Medicine

## 2023-11-06 ENCOUNTER — Encounter: Payer: Self-pay | Admitting: Family Medicine

## 2023-11-06 DIAGNOSIS — M222X9 Patellofemoral disorders, unspecified knee: Secondary | ICD-10-CM | POA: Diagnosis not present

## 2023-11-06 MED ORDER — NALTREXONE HCL 50 MG PO TABS: 25.0000 mg | ORAL_TABLET | Freq: Every day | ORAL | 3 refills | Status: DC

## 2023-11-06 MED ORDER — BUPROPION HCL ER (SR) 150 MG PO TB12
150.0000 mg | ORAL_TABLET | Freq: Two times a day (BID) | ORAL | 3 refills | Status: DC
Start: 2023-11-06 — End: 2024-03-17

## 2023-11-06 NOTE — Assessment & Plan Note (Signed)
 She weighs 285 pounds, returning to her previous weight before initial weight loss. Phentermine was effective for weight loss but led to weight regain post-discontinuation. She seeks alternative options due to phentermine's short-term efficacy and knee pain limiting exercise. Phentermine is not favored due to potential weight regain. Discussed options include Qsymia (phentermine and Topamax) and Contrave (Wellbutrin and naltrexone). Contrave is recommended for long-term use and reducing cravings, aiding sustained weight loss. She agreed to try Contrave. GLP-1 medications like Wegovy and Zepbound are not covered by her state health plan, and compounded versions may be unreliable or unavailable. - Prescribe Wellbutrin 150 mg twice daily, to be taken in the morning and mid-afternoon. - Prescribe naltrexone, half a pill once daily, to be taken with one of the Wellbutrin doses. - Send prescriptions to [facility] on [address]. - Schedule follow-up appointment in July to assess progress.

## 2023-11-06 NOTE — Progress Notes (Signed)
 MyChart Video Visit    Virtual Visit via Video Note   This format is felt to be most appropriate for this patient at this time. Physical exam was limited by quality of the video and audio technology used for the visit.    Patient location: home Provider location: [facility] Persons involved in the visit: patient, provider  I discussed the limitations of evaluation and management by telemedicine and the availability of in person appointments. The patient expressed understanding and agreed to proceed.  Patient: Deanna Newman   DOB: May 08, 1986   38 y.o. Female  MRN: [id number] Visit Date: 11/06/2023  Today's healthcare provider: Shirlee Latch, MD   No chief complaint on file.  Subjective    HPI   Discussed the use of AI scribe software for clinical note transcription with the patient, who gave verbal consent to proceed.  History of Present Illness   The patient, with a history of weight fluctuations and a recent knee injury, presents with concerns about her weight loss journey. She reports difficulty in maintaining an exercise routine due to a knee injury diagnosed as runner's knee. The knee pain is exacerbated by certain movements such as squats and bending, and it has significantly impacted her mobility and ability to engage in physical activities. The patient has previously tried phentermine for weight loss, which resulted in a significant weight loss of 80 pounds. However, she regained the weight after discontinuing the medication. The patient is interested in exploring other weight loss options, including injections and other medications. She has seen advertisements for weight loss shots online but is hesitant to try them without medical advice. The patient is currently insured under the [facility] and is unsure about the coverage for different weight loss treatments. She expresses frustration and discouragement with her current weight, which has  returned to her initial weight before her previous weight loss efforts.       Review of Systems      Objective    There were no vitals taken for this visit.      Physical Exam Constitutional:      General: She is not in acute distress.    Appearance: Normal appearance.  HENT:     Head: Normocephalic.  Pulmonary:     Effort: Pulmonary effort is normal. No respiratory distress.  Neurological:     Mental Status: She is alert and oriented to person, place, and time. Mental status is at baseline.        Assessment & Plan     Problem List Items Addressed This Visit       Other   Morbid obesity (HCC) - Primary   She weighs 285 pounds, returning to her previous weight before initial weight loss. Phentermine was effective for weight loss but led to weight regain post-discontinuation. She seeks alternative options due to phentermine's short-term efficacy and knee pain limiting exercise. Phentermine is not favored due to potential weight regain. Discussed options include Qsymia (phentermine and Topamax) and Contrave (Wellbutrin and naltrexone). Contrave is recommended for long-term use and reducing cravings, aiding sustained weight loss. She agreed to try Contrave. GLP-1 medications like Wegovy and Zepbound are not covered by her state health plan, and compounded versions may be unreliable or unavailable. - Prescribe Wellbutrin 150 mg twice daily, to be taken in the morning and mid-afternoon. - Prescribe naltrexone, half a pill once daily, to be taken with one of the Wellbutrin doses. - Send prescriptions to [facility] on  [address]. - Schedule follow-up appointment in July to assess progress.      Other Visit Diagnoses       Patellofemoral stress syndrome, unspecified laterality               Patellofemoral Pain Syndrome (Runner's Knee) Knee pain, diagnosed as runner's knee, exacerbated by activities like squats and bending, impacts her exercise ability and weight loss  efforts. Weight loss may alleviate knee pain, enhancing exercise capacity and weight management. - Advise modification of exercises to avoid exacerbating knee pain. - Encourage weight loss to potentially reduce knee pain.  General Health Maintenance She inquired about a Pap smear due to her previous provider leaving. Her last Pap smear in 2021 is valid for five years, so the next is due in 2026. The provider confirmed availability for the procedure when due. - Schedule Pap smear in 2026.       Meds ordered this encounter  Medications   buPROPion (WELLBUTRIN SR) 150 MG 12 hr tablet    Sig: Take 1 tablet (150 mg total) by mouth 2 (two) times daily.    Dispense:  60 tablet    Refill:  3   naltrexone (DEPADE) 50 MG tablet    Sig: Take 0.5 tablets (25 mg total) by mouth daily.    Dispense:  15 tablet    Refill:  3     Return in about 4 months (around 03/07/2024) for as scheduled.     I discussed the assessment and treatment plan with the patient. The patient was provided an opportunity to ask questions and all were answered. The patient agreed with the plan and demonstrated an understanding of the instructions.   The patient was advised to call back or seek an in-person evaluation if the symptoms worsen or if the condition fails to improve as anticipated.   Shirlee Latch, MD [facility] [phone] (phone) [phone] (fax)  [facility]

## 2023-11-27 ENCOUNTER — Encounter: Payer: Self-pay | Admitting: Family Medicine

## 2024-01-03 ENCOUNTER — Encounter: Payer: Self-pay | Admitting: Family Medicine

## 2024-01-07 MED ORDER — TOPIRAMATE 50 MG PO TABS: 50.0000 mg | ORAL_TABLET | Freq: Every evening | ORAL | 2 refills | Status: DC

## 2024-01-07 NOTE — Telephone Encounter (Signed)
 Go ahead and send in the Topamax as written below.

## 2024-03-06 ENCOUNTER — Encounter: Payer: Self-pay | Admitting: Family Medicine

## 2024-03-17 ENCOUNTER — Ambulatory Visit: Admitting: Family Medicine

## 2024-03-17 VITALS — BP 121/87 | HR 88 | Ht 65.0 in | Wt 292.7 lb

## 2024-03-17 DIAGNOSIS — Z0000 Encounter for general adult medical examination without abnormal findings: Secondary | ICD-10-CM

## 2024-03-17 DIAGNOSIS — Z0001 Encounter for general adult medical examination with abnormal findings: Secondary | ICD-10-CM

## 2024-03-17 DIAGNOSIS — E049 Nontoxic goiter, unspecified: Secondary | ICD-10-CM

## 2024-03-17 DIAGNOSIS — J4520 Mild intermittent asthma, uncomplicated: Secondary | ICD-10-CM | POA: Diagnosis not present

## 2024-03-17 DIAGNOSIS — R600 Localized edema: Secondary | ICD-10-CM

## 2024-03-17 DIAGNOSIS — E559 Vitamin D deficiency, unspecified: Secondary | ICD-10-CM

## 2024-03-17 DIAGNOSIS — M222X2 Patellofemoral disorders, left knee: Secondary | ICD-10-CM

## 2024-03-17 DIAGNOSIS — N920 Excessive and frequent menstruation with regular cycle: Secondary | ICD-10-CM

## 2024-03-17 DIAGNOSIS — D508 Other iron deficiency anemias: Secondary | ICD-10-CM

## 2024-03-17 DIAGNOSIS — Z23 Encounter for immunization: Secondary | ICD-10-CM

## 2024-03-17 DIAGNOSIS — F418 Other specified anxiety disorders: Secondary | ICD-10-CM

## 2024-03-17 DIAGNOSIS — G4489 Other headache syndrome: Secondary | ICD-10-CM

## 2024-03-17 MED ORDER — TOPIRAMATE 50 MG PO TABS: 50.0000 mg | ORAL_TABLET | Freq: Every evening | ORAL | 2 refills | Status: DC

## 2024-03-17 MED ORDER — PHENTERMINE HCL 37.5 MG PO CAPS: 37.5000 mg | ORAL_CAPSULE | ORAL | 2 refills | Status: DC

## 2024-03-17 NOTE — Assessment & Plan Note (Signed)
 Chronic obesity with difficulty in weight management. Previous treatments with phentermine  and Topamax  were ineffective long-term. Consideration of combined use of phentermine  and Topamax  to enhance weight loss and prevent weight regain. Referral to [facility] if current plan is ineffective. GLP-1 medications are limited due to lack of insurance coverage and high out-of-pocket cost. - Prescribe phentermine  37.5 mg once daily in the morning - Prescribe Topamax  50 mg at bedtime - Consider referral to [facility] if needed

## 2024-03-17 NOTE — Assessment & Plan Note (Signed)
 Iron deficiency anemia likely exacerbated by heavy menstrual bleeding. Menstrual cycles are regular but heavy for the first three days, lasting a full week. Not currently taking iron supplements. - Encourage use of iron supplements - Discuss use of iron fish for dietary iron supplementation

## 2024-03-17 NOTE — Progress Notes (Signed)
 Complete physical exam   Patient: Deanna Newman   DOB: 1985/12/10   38 y.o. Female  MRN: [id number] Visit Date: 03/17/2024  Today's healthcare provider: Jon Eva, MD   Chief Complaint  Patient presents with   Annual Exam    Last completed 03/06/23 Diet -  General, well balanced Exercise - three times a week for at least thirty minutes due to knee Feeling - fairly well due to feeling depressed  Sleeping - poorly  due to stress and not able to fall asleep when laying down Concerns -  weight   Care Management    HPV Vaccines - ok to order Pneumococcal Vaccine - ok to order   Subjective    MYLES MALLICOAT is a 38 y.o. female who presents today for a complete physical exam.    Discussed the use of AI scribe software for clinical note transcription with the patient, who gave verbal consent to proceed.  History of Present Illness   Deanna Newman is a 38 year old female who presents for an annual physical exam with concerns about weight management, knee pain, and menstrual irregularities.  She struggles with weight management, having previously lost weight with phentermine  but regained it after stopping the medication and following gallbladder surgery. Topamax  was ineffective and caused adverse effects. She manages her diet by avoiding fried and greasy foods and eating between 12 PM and 7 PM. Knee pain, diagnosed as patellofemoral pain syndrome, limits her ability to participate in weightlifting classes. She now uses a walking pad for exercise.  Menstrual cycles are regular but involve heavy bleeding for the first three days, lasting seven days in total. She is not consistent with iron supplements, which may affect her anemia.  She experiences increased headaches, possibly due to stress from her husband's irregular income. She discontinued naltrexone  due to adverse effects and is not currently on Wellbutrin .  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        Last depression screening scores    03/17/2024    9:15 AM 03/06/2023    8:38 AM 11/30/2022    8:11 AM  PHQ 2/9 Scores  PHQ - 2 Score 5 1 0  PHQ- 9 Score 14 6 4    Last fall risk screening    03/17/2024    9:15 AM  Fall Risk   Falls in the past year? 0  Number falls in past yr: 0  Injury with Fall? 0  Risk for fall due to : No Fall Risks  Follow up Falls evaluation completed        Medications: Outpatient Medications Prior to Visit  Medication Sig   albuterol  (VENTOLIN  HFA) 108 (90 Base) MCG/ACT inhaler Inhale 2 puffs into the lungs every 6 (six) hours as needed for wheezing or shortness of breath.   [DISCONTINUED] topiramate  (TOPAMAX ) 50 MG tablet Take 1 tablet (50 mg total) by mouth at bedtime.   [DISCONTINUED] buPROPion  (WELLBUTRIN  SR) 150 MG 12 hr tablet Take 1 tablet (150 mg total) by mouth 2 (two) times daily.   No facility-administered medications prior to visit.    Review of Systems    Objective    BP 121/87 (BP Location: Left Arm, Patient Position: Sitting, Cuff Size: Large)   Pulse 88   Ht 5' 5 (1.651 m)   Wt 292 lb 11.2 oz (132.8 kg)   LMP 03/12/2024 (Exact  Date)   SpO2 100%   BMI 48.71 kg/m    Physical Exam Vitals reviewed.  Constitutional:      General: She is not in acute distress.    Appearance: Normal appearance. She is well-developed. She is not diaphoretic.  HENT:     Head: Normocephalic and atraumatic.     Right Ear: Tympanic membrane, ear canal and external ear normal.     Left Ear: Tympanic membrane, ear canal and external ear normal.     Nose: Nose normal.     Mouth/Throat:     Mouth: Mucous membranes are moist.     Pharynx: Oropharynx is clear. No oropharyngeal exudate.  Eyes:     General: No scleral icterus.    Conjunctiva/sclera: Conjunctivae normal.     Pupils: Pupils are equal, round, and reactive to light.  Neck:     Thyroid: Thyromegaly  present. No thyroid mass or thyroid tenderness.  Cardiovascular:     Rate and Rhythm: Normal rate and regular rhythm.     Heart sounds: Normal heart sounds. No murmur heard. Pulmonary:     Effort: Pulmonary effort is normal. No respiratory distress.     Breath sounds: Normal breath sounds. No wheezing or rales.  Abdominal:     General: There is no distension.     Palpations: Abdomen is soft.     Tenderness: There is no abdominal tenderness.  Musculoskeletal:        General: No deformity.     Cervical back: Neck supple.     Right lower leg: No edema.     Left lower leg: No edema.  Lymphadenopathy:     Cervical: No cervical adenopathy.  Skin:    General: Skin is warm and dry.     Findings: No rash.  Neurological:     Mental Status: She is alert and oriented to person, place, and time. Mental status is at baseline.     Gait: Gait normal.  Psychiatric:        Mood and Affect: Mood normal.        Behavior: Behavior normal.        Thought Content: Thought content normal.      No results found for any visits on 03/17/24.  Assessment & Plan    Routine Health Maintenance and Physical Exam  Exercise Activities and Dietary recommendations  Goals   None     Immunization History  Administered Date(s) Administered   HPV 9-valent 03/17/2024   HPV Quadrivalent 08/23/2010   Influenza, Seasonal, Injecte, Preservative Fre 05/24/2012   Influenza,inj,Quad PF,6+ Mos 05/14/2018, 05/28/2022   Influenza-Unspecified 05/14/2018, 06/11/2020, 06/26/2021   PFIZER(Purple Top)SARS-COV-2 Vaccination 12/06/2019, 12/30/2019   PNEUMOCOCCAL CONJUGATE-20 03/17/2024   Tdap 01/01/2012, 05/14/2018    Health Maintenance  Topic Date Due   Hepatitis B Vaccines (1 of 3 - 19+ 3-dose series) Never done   COVID-19 Vaccine (3 - 2024-25 season) 04/29/2023   HPV VACCINES (3 - 3-dose series) 06/09/2024   INFLUENZA VACCINE  03/28/2024   Cervical Cancer Screening (HPV/Pap Cotest)  03/24/2025   DTaP/Tdap/Td (3  - Td or Tdap) 05/14/2028   Pneumococcal Vaccine 11-96 Years old  Completed   Hepatitis C Screening  Completed   HIV Screening  Completed   Meningococcal B Vaccine  Aged Out    Discussed health benefits of physical activity, and encouraged her to engage in regular exercise appropriate for her age and condition.  Problem List Items Addressed This Visit       Respiratory  Mild intermittent asthma   Relevant Orders   Pneumococcal conjugate vaccine 20-valent (Prevnar 20) (Completed)     Other   Morbid obesity (HCC)   Chronic obesity with difficulty in weight management. Previous treatments with phentermine  and Topamax  were ineffective long-term. Consideration of combined use of phentermine  and Topamax  to enhance weight loss and prevent weight regain. Referral to [facility] if current plan is ineffective. GLP-1 medications are limited due to lack of insurance coverage and high out-of-pocket cost. - Prescribe phentermine  37.5 mg once daily in the morning - Prescribe Topamax  50 mg at bedtime - Consider referral to [facility] if needed      Relevant Medications   phentermine  37.5 MG capsule   Other Relevant Orders   Comprehensive metabolic panel with GFR   Lipid panel   Anemia, iron deficiency   Iron deficiency anemia likely exacerbated by heavy menstrual bleeding. Menstrual cycles are regular but heavy for the first three days, lasting a full week. Not currently taking iron supplements. - Encourage use of iron supplements - Discuss use of iron fish for dietary iron supplementation      Relevant Orders   Iron, TIBC and Ferritin Panel   CBC w/Diff/Platelet   Situational anxiety   Vitamin D  deficiency   Relevant Orders   VITAMIN D  25 Hydroxy (Vit-D Deficiency, Fractures)   Menorrhagia   Relevant Orders   CBC w/Diff/Platelet   TSH   Other Visit Diagnoses       Annual physical exam    -  Primary   Relevant Orders   Comprehensive  metabolic panel with GFR   Lipid panel   Iron, TIBC and Ferritin Panel   CBC w/Diff/Platelet   TSH   VITAMIN D  25 Hydroxy (Vit-D Deficiency, Fractures)     Immunization due       Relevant Orders   HPV 9-valent vaccine,Recombinat (Completed)   Pneumococcal conjugate vaccine 20-valent (Prevnar 20) (Completed)     Goiter       Relevant Orders   US  THYROID     Other headache syndrome       Relevant Medications   topiramate  (TOPAMAX ) 50 MG tablet     Lower extremity edema         Patellofemoral disorder, left               Knee pain due to patellofemoral pain syndrome Chronic knee pain due to patellofemoral pain syndrome. Advised against activities involving knee bending, such as weightlifting. Walking is not enjoyable for her, and cycling is suggested as a beneficial alternative for knee pain and strengthening quads. - Recommend cycling as an alternative exercise to walking  Headaches Increased frequency of headaches, likely stress-related due to financial issues. Topamax  was previously used but discontinued due to lack of perceived benefit and potential stress-related headaches.  Thyroid gland enlargement Thyroid gland feels enlarged upon examination. Thyroid function tests have been normal in the past, but an ultrasound is warranted to assess the anatomical structure. - Order thyroid ultrasound  Lower extremity edema Lower extremity edema, likely due to prolonged sitting and increased weight. Symptoms improve with elevation and worsen by the end of the day. - Advise elevation of legs - Recommend use of compression socks  Mildly elevated blood pressure Mildly elevated diastolic blood pressure noted. - Recheck blood pressure       Return in about 3 months (around 06/17/2024) for weight f/u, virtual ok.     Jon Eva, MD  [facility] [phone] (phone) [phone] (fax)  [facility]

## 2024-03-18 ENCOUNTER — Ambulatory Visit: Payer: Self-pay | Admitting: Family Medicine

## 2024-03-18 ENCOUNTER — Telehealth: Payer: Self-pay

## 2024-03-18 LAB — CBC WITH DIFFERENTIAL/PLATELET
Basophils Absolute: 0 x10E3/uL (ref 0.0–0.2)
Basos: 1 %
EOS (ABSOLUTE): 0.2 x10E3/uL (ref 0.0–0.4)
Eos: 3 %
Hematocrit: 36.7 % (ref 34.0–46.6)
Hemoglobin: 10.4 g/dL — ABNORMAL LOW (ref 11.1–15.9)
Immature Grans (Abs): 0 x10E3/uL (ref 0.0–0.1)
Immature Granulocytes: 0 %
Lymphocytes Absolute: 2.4 x10E3/uL (ref 0.7–3.1)
Lymphs: 33 %
MCH: 21 pg — ABNORMAL LOW (ref 26.6–33.0)
MCHC: 28.3 g/dL — ABNORMAL LOW (ref 31.5–35.7)
MCV: 74 fL — ABNORMAL LOW (ref 79–97)
Monocytes Absolute: 0.5 x10E3/uL (ref 0.1–0.9)
Monocytes: 7 %
Neutrophils Absolute: 4.1 x10E3/uL (ref 1.4–7.0)
Neutrophils: 55 %
Platelets: 434 x10E3/uL (ref 150–450)
RBC: 4.96 x10E6/uL (ref 3.77–5.28)
RDW: 16 % — ABNORMAL HIGH (ref 11.7–15.4)
WBC: 7.3 x10E3/uL (ref 3.4–10.8)

## 2024-03-18 LAB — LIPID PANEL
Chol/HDL Ratio: 4 ratio (ref 0.0–4.4)
Cholesterol, Total: 152 mg/dL (ref 100–199)
HDL: 38 mg/dL — ABNORMAL LOW (ref >39)
LDL Chol Calc (NIH): 92 mg/dL (ref 0–99)
Triglycerides: 123 mg/dL (ref 0–149)
VLDL Cholesterol Cal: 22 mg/dL (ref 5–40)

## 2024-03-18 LAB — COMPREHENSIVE METABOLIC PANEL WITH GFR
ALT: 12 IU/L (ref 0–32)
AST: 17 IU/L (ref 0–40)
Albumin: 4.2 g/dL (ref 3.9–4.9)
Alkaline Phosphatase: 87 IU/L (ref 44–121)
BUN/Creatinine Ratio: 16 (ref 9–23)
BUN: 13 mg/dL (ref 6–20)
Bilirubin Total: <0.2 mg/dL (ref 0.0–1.2)
CO2: 20 mmol/L (ref 20–29)
Calcium: 9.5 mg/dL (ref 8.7–10.2)
Chloride: 105 mmol/L (ref 96–106)
Creatinine, Ser: 0.79 mg/dL (ref 0.57–1.00)
Globulin, Total: 3.1 g/dL (ref 1.5–4.5)
Glucose: 93 mg/dL (ref 70–99)
Potassium: 4.6 mmol/L (ref 3.5–5.2)
Sodium: 141 mmol/L (ref 134–144)
Total Protein: 7.3 g/dL (ref 6.0–8.5)
eGFR: 99 mL/min/1.73 (ref >59)

## 2024-03-18 LAB — IRON,TIBC AND FERRITIN PANEL
Ferritin: 4 ng/mL — ABNORMAL LOW (ref 15–150)
Iron Saturation: 5 % — CL (ref 15–55)
Iron: 18 ug/dL — ABNORMAL LOW (ref 27–159)
Total Iron Binding Capacity: 360 ug/dL (ref 250–450)
UIBC: 342 ug/dL (ref 131–425)

## 2024-03-18 LAB — TSH: TSH: 1.83 u[IU]/mL (ref 0.450–4.500)

## 2024-03-18 LAB — VITAMIN D 25 HYDROXY (VIT D DEFICIENCY, FRACTURES): Vit D, 25-Hydroxy: 25.9 ng/mL — ABNORMAL LOW (ref 30.0–100.0)

## 2024-03-18 NOTE — Telephone Encounter (Signed)
*  [employer] Patient Advocate Encounter   Received notification from Fax that prior authorization for Phentermine  HCl 37.5MG  capsules  is required/requested.   Insurance verification completed.   The patient is insured through [facility] .   Per test claim: PA required; PA submitted to above mentioned insurance via CoverMyMeds Key/confirmation #/EOC [id number] Status is pending

## 2024-03-19 ENCOUNTER — Other Ambulatory Visit (HOSPITAL_COMMUNITY): Payer: Self-pay

## 2024-03-19 NOTE — Telephone Encounter (Signed)
 Pharmacy Patient Advocate Encounter  Received notification from [facility] that Prior Authorization for Phentermine  HCl 37.5MG  capsules  has been APPROVED from 03/18/2024 to 06/18/2024. Ran test claim, Copay is $5.00. This test claim was processed through [facility]- copay amounts may vary at other pharmacies due to pharmacy/plan contracts, or as the patient moves through the different stages of their insurance plan.

## 2024-03-25 ENCOUNTER — Ambulatory Visit
Admission: RE | Admit: 2024-03-25 | Discharge: 2024-03-25 | Disposition: A | Discharge status: Home / Self Care | Source: Ambulatory Visit | Attending: Family Medicine | Admitting: Family Medicine

## 2024-03-25 DIAGNOSIS — E049 Nontoxic goiter, unspecified: Secondary | ICD-10-CM | POA: Insufficient documentation

## 2024-06-19 ENCOUNTER — Encounter: Payer: Self-pay | Admitting: Family Medicine

## 2024-06-19 ENCOUNTER — Ambulatory Visit: Admitting: Family Medicine

## 2024-06-19 DIAGNOSIS — D508 Other iron deficiency anemias: Secondary | ICD-10-CM | POA: Diagnosis not present

## 2024-06-19 DIAGNOSIS — E559 Vitamin D deficiency, unspecified: Secondary | ICD-10-CM | POA: Diagnosis not present

## 2024-06-19 DIAGNOSIS — B001 Herpesviral vesicular dermatitis: Secondary | ICD-10-CM

## 2024-06-19 MED ORDER — TOPIRAMATE 50 MG PO TABS: 50.0000 mg | ORAL_TABLET | Freq: Every evening | ORAL | 2 refills | Status: AC

## 2024-06-19 MED ORDER — PHENTERMINE HCL 37.5 MG PO CAPS: 37.5000 mg | ORAL_CAPSULE | ORAL | 2 refills | Status: DC

## 2024-06-19 MED ORDER — VALACYCLOVIR HCL 1 G PO TABS
2000.0000 mg | ORAL_TABLET | Freq: Two times a day (BID) | ORAL | 2 refills | Status: AC
Start: 2024-06-19 — End: ?

## 2024-06-19 NOTE — Progress Notes (Signed)
 Established patient visit   Patient: Deanna Newman   DOB: [birth year]   38 y.o. Female  MRN: [id number] Visit Date: 06/19/2024  Today's healthcare provider: Jon Eva, MD   Chief Complaint  Patient presents with   Follow-up    Patient is present for 3 month weight follow-up   Mouth Lesions    Patient reports sores on lip since earlier this. Reports she attempted to pop on Wednesday night and caused it to become worse as she now has two. Unsure if it is cold sore or canker sore.    Subjective    Mouth Lesions  Associated symptoms include mouth sores.   HPI     Follow-up    Additional comments: Patient is present for 3 month weight follow-up        Mouth Lesions    Additional comments: Patient reports sores on lip since earlier this. Reports she attempted to pop on Wednesday night and caused it to become worse as she now has two. Unsure if it is cold sore or canker sore.       Last edited by Lilian Fitzpatrick, CMA on 06/19/2024  8:48 AM.       Discussed the use of AI scribe software for clinical note transcription with the patient, who gave verbal consent to proceed.  History of Present Illness   Deanna Newman Deanna Newman is a 38 year old female with morbid obesity who presents for follow-up on weight management and anemia.  She has a BMI of 45 and is currently taking phentermine  37.5 mg and Topamax  50 mg for weight loss. She has successfully lost nearly 20 pounds, reducing her weight from 292 to 274 pounds over the past few months. She experiences no heart palpitations or abnormal symptoms while on these medications. Topamax  is taken at bedtime without causing drowsiness or brain fog.  She experiences iron deficiency anemia associated with her menstrual periods. She takes vitamin D  and a multivitamin with high iron content. Her menstrual periods are extremely heavy for the first three days, lasting a total of ten days, with the last two days being lighter. She  missed her period in September but had a heavy period in October. Her periods have been very heavy since menarche at age 21, and she was previously on birth control to manage the bleeding.         Medications: Outpatient Medications Prior to Visit  Medication Sig   albuterol  (VENTOLIN  HFA) 108 (90 Base) MCG/ACT inhaler Inhale 2 puffs into the lungs every 6 (six) hours as needed for wheezing or shortness of breath.   Cholecalciferol (VITAMIN D3) 50 MCG (2000 UT) CAPS Take by mouth.   Multiple Vitamin (MULTIVITAMIN) tablet Take 1 tablet by mouth daily.   [DISCONTINUED] phentermine  37.5 MG capsule Take 1 capsule (37.5 mg total) by mouth every morning.   [DISCONTINUED] topiramate  (TOPAMAX ) 50 MG tablet Take 1 tablet (50 mg total) by mouth at bedtime.   No facility-administered medications prior to visit.    Review of Systems  HENT:  Positive for mouth sores.        Objective    BP 116/89 (BP Location: Left Arm, Patient Position: Sitting, Cuff Size: Large)   Pulse 87   Ht 5' 5 (1.651 m)   Wt 274 lb 12.8 oz (124.6 kg)   SpO2 100%   BMI 45.73 kg/m  Wt Readings from Last 3 Encounters:  06/19/24 274 lb 12.8 oz (124.6 kg)  03/17/24 292  lb 11.2 oz (132.8 kg)  03/06/23 264 lb 12.8 oz (120.1 kg)      Physical Exam Vitals reviewed.  Constitutional:      General: She is not in acute distress.    Appearance: Normal appearance. She is well-developed. She is not diaphoretic.  HENT:     Head: Normocephalic and atraumatic.  Eyes:     General: No scleral icterus.    Conjunctiva/sclera: Conjunctivae normal.  Neck:     Thyroid : No thyromegaly.  Cardiovascular:     Rate and Rhythm: Normal rate and regular rhythm.     Heart sounds: Normal heart sounds. No murmur heard. Pulmonary:     Effort: Pulmonary effort is normal. No respiratory distress.     Breath sounds: Normal breath sounds. No wheezing, rhonchi or rales.  Musculoskeletal:     Cervical back: Neck supple.     Right lower  leg: No edema.     Left lower leg: No edema.  Lymphadenopathy:     Cervical: No cervical adenopathy.  Skin:    General: Skin is warm and dry.     Findings: No rash.  Neurological:     Mental Status: She is alert and oriented to person, place, and time. Mental status is at baseline.  Psychiatric:        Mood and Affect: Mood normal.        Behavior: Behavior normal.     Cold sore on L lower lip  No results found for any visits on 06/19/24.  Assessment & Plan     Problem List Items Addressed This Visit       Other   Morbid obesity (HCC) - Primary   Morbid obesity with a BMI of 45. She has been on phentermine  and Topamax , resulting in a weight loss of almost 20 pounds over the past few months. Reports no adverse effects such as heart palpitations, and her blood pressure is well-managed. - Continue phentermine  37.5 mg - Continue Topamax  50 mg at bedtime - Schedule follow-up in 3 months to monitor weight and medication efficacy      Relevant Medications   phentermine  37.5 MG capsule   Anemia, iron deficiency   Iron deficiency anemia likely secondary to heavy and prolonged menstrual bleeding. Hemoglobin levels are slowly improving with supplementation. Menstrual periods are heavy for the first three days and last for ten days, contributing to anemia. - Order CBC and iron panel to monitor anemia - Continue iron supplementation      Relevant Orders   CBC w/Diff/Platelet   Iron, TIBC and Ferritin Panel   Vitamin D  deficiency   Vitamin D  deficiency noted, though not severely low. Supplementation has been initiated to address this deficiency. 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 She reports cold sores and has previously used Valtrex. The current episode may have been exacerbated by trauma to the lip. Valtrex can help with swelling and symptoms if taken early in the course. - Prescribe Valtrex 2 grams, twice a day for one day - Provide refills for Valtrex for  future episodes        Return in about 3 months (around 09/19/2024) for weight f/u, virtual ok.       Jon Eva, MD  [facility] [phone] (phone) [phone] (fax)  [facility]

## 2024-06-19 NOTE — Assessment & Plan Note (Signed)
 Morbid obesity with a BMI of 45. She has been on phentermine  and Topamax , resulting in a weight loss of almost 20 pounds over the past few months. Reports no adverse effects such as heart palpitations, and her blood pressure is well-managed. - Continue phentermine  37.5 mg - Continue Topamax  50 mg at bedtime - Schedule follow-up in 3 months to monitor weight and medication efficacy

## 2024-06-19 NOTE — Assessment & Plan Note (Signed)
 Vitamin D  deficiency noted, though not severely low. Supplementation has been initiated to address this deficiency. - Order vitamin D  level to monitor deficiency - Continue vitamin D  supplementation

## 2024-06-19 NOTE — Assessment & Plan Note (Signed)
 Iron deficiency anemia likely secondary to heavy and prolonged menstrual bleeding. Hemoglobin levels are slowly improving with supplementation. Menstrual periods are heavy for the first three days and last for ten days, contributing to anemia. - Order CBC and iron panel to monitor anemia - Continue iron supplementation

## 2024-06-20 ENCOUNTER — Ambulatory Visit: Payer: Self-pay | Admitting: Family Medicine

## 2024-06-20 LAB — CBC WITH DIFFERENTIAL/PLATELET
Basophils Absolute: 0 x10E3/uL (ref 0.0–0.2)
Basos: 0 %
EOS (ABSOLUTE): 0.2 x10E3/uL (ref 0.0–0.4)
Eos: 2 %
Hematocrit: 34 % (ref 34.0–46.6)
Hemoglobin: 9.9 g/dL — ABNORMAL LOW (ref 11.1–15.9)
Immature Grans (Abs): 0 x10E3/uL (ref 0.0–0.1)
Immature Granulocytes: 0 %
Lymphocytes Absolute: 2.7 x10E3/uL (ref 0.7–3.1)
Lymphs: 30 %
MCH: 21 pg — ABNORMAL LOW (ref 26.6–33.0)
MCHC: 29.1 g/dL — ABNORMAL LOW (ref 31.5–35.7)
MCV: 72 fL — ABNORMAL LOW (ref 79–97)
Monocytes Absolute: 0.6 x10E3/uL (ref 0.1–0.9)
Monocytes: 7 %
Neutrophils Absolute: 5.3 x10E3/uL (ref 1.4–7.0)
Neutrophils: 61 %
Platelets: 459 x10E3/uL — ABNORMAL HIGH (ref 150–450)
RBC: 4.71 x10E6/uL (ref 3.77–5.28)
RDW: 14.6 % (ref 11.7–15.4)
WBC: 8.9 x10E3/uL (ref 3.4–10.8)

## 2024-06-20 LAB — IRON,TIBC AND FERRITIN PANEL
Ferritin: 5 ng/mL — ABNORMAL LOW (ref 15–150)
Iron Saturation: 8 % — CL (ref 15–55)
Iron: 26 ug/dL — ABNORMAL LOW (ref 27–159)
Total Iron Binding Capacity: 335 ug/dL (ref 250–450)
UIBC: 309 ug/dL (ref 131–425)

## 2024-06-20 LAB — VITAMIN D 25 HYDROXY (VIT D DEFICIENCY, FRACTURES): Vit D, 25-Hydroxy: 32.4 ng/mL (ref 30.0–100.0)

## 2024-08-04 ENCOUNTER — Telehealth: Payer: Self-pay | Admitting: Family Medicine

## 2024-08-04 NOTE — Telephone Encounter (Signed)
 [facility] is requesting documentation as to why you are treating this patient longer than 3 months per [employer] guidelines.  Pt is requesting 5th refill on Phentermine  37.5 mg.

## 2024-08-05 NOTE — Telephone Encounter (Signed)
 She is taking with topamax  (like Qsymia combo), which can be used for longer than 3 months at a time. She continues to see improvement in morbid obesity as a result of this medicaton

## 2024-08-07 NOTE — Telephone Encounter (Signed)
 Called [facility] and spoke with Corean informed her that the provider stated that she is seeing improvement in morbid obesity as a result of the medication.  On 03/17/2024 which is when the patient started the medication Topamax  her BMI was 48.71% and the last visit which was 06/19/2024 was 45.73%.  I let Corean know this information for her documentation, stated she filled the medication and contact the patient when it is ready for pick up.

## 2024-09-22 ENCOUNTER — Ambulatory Visit: Admitting: Family Medicine

## 2024-09-30 ENCOUNTER — Ambulatory Visit: Admitting: Family Medicine

## 2024-09-30 ENCOUNTER — Encounter: Payer: Self-pay | Admitting: Family Medicine

## 2024-09-30 DIAGNOSIS — E559 Vitamin D deficiency, unspecified: Secondary | ICD-10-CM | POA: Diagnosis not present

## 2024-09-30 DIAGNOSIS — Z789 Other specified health status: Secondary | ICD-10-CM

## 2024-09-30 DIAGNOSIS — D508 Other iron deficiency anemias: Secondary | ICD-10-CM

## 2024-09-30 MED ORDER — PHENTERMINE HCL 15 MG PO CAPS: 15.0000 mg | ORAL_CAPSULE | ORAL | 2 refills | Status: AC

## 2024-09-30 NOTE — Assessment & Plan Note (Signed)
 Managed with daily iron supplements. She reports consistent intake except during a recent illness. - Ordered CBC to assess current iron levels.

## 2024-09-30 NOTE — Assessment & Plan Note (Signed)
 Managed with phentermine  and Topamax , resulting in a 30-pound weight loss. Temporary discontinuation of medication due to illness did not result in weight gain. Current regimen is effective, but a reduction in phentermine  dose is considered for long-term safety due to its stimulant nature. - Reduced phentermine  dose to 15 mg daily. - Continue Topamax  at current dose. - Monitor weight and adjust Topamax  if increased hunger or cravings occur. - Sent phentermine  refill to on [address].

## 2024-10-01 ENCOUNTER — Other Ambulatory Visit (HOSPITAL_COMMUNITY): Payer: Self-pay

## 2024-10-01 LAB — IRON,TIBC AND FERRITIN PANEL
Ferritin: 6 ng/mL — ABNORMAL LOW (ref 15–150)
Iron Saturation: 6 % — CL (ref 15–55)
Iron: 20 ug/dL — ABNORMAL LOW (ref 27–159)
Total Iron Binding Capacity: 362 ug/dL (ref 250–450)
UIBC: 342 ug/dL (ref 131–425)

## 2024-10-01 LAB — CBC WITH DIFFERENTIAL/PLATELET
Basophils Absolute: 0.1 10*3/uL (ref 0.0–0.2)
Basos: 1 %
EOS (ABSOLUTE): 0.2 10*3/uL (ref 0.0–0.4)
Eos: 2 %
Hematocrit: 35.9 % (ref 34.0–46.6)
Hemoglobin: 10.8 g/dL — ABNORMAL LOW (ref 11.1–15.9)
Immature Grans (Abs): 0 10*3/uL (ref 0.0–0.1)
Immature Granulocytes: 0 %
Lymphocytes Absolute: 3.2 10*3/uL — ABNORMAL HIGH (ref 0.7–3.1)
Lymphs: 36 %
MCH: 21.6 pg — ABNORMAL LOW (ref 26.6–33.0)
MCHC: 30.1 g/dL — ABNORMAL LOW (ref 31.5–35.7)
MCV: 72 fL — ABNORMAL LOW (ref 79–97)
Monocytes Absolute: 0.6 10*3/uL (ref 0.1–0.9)
Monocytes: 7 %
Neutrophils Absolute: 4.9 10*3/uL (ref 1.4–7.0)
Neutrophils: 54 %
Platelets: 501 10*3/uL — ABNORMAL HIGH (ref 150–450)
RBC: 4.99 x10E6/uL (ref 3.77–5.28)
RDW: 15.3 % (ref 11.7–15.4)
WBC: 9 10*3/uL (ref 3.4–10.8)

## 2024-10-01 LAB — VITAMIN D 25 HYDROXY (VIT D DEFICIENCY, FRACTURES): Vit D, 25-Hydroxy: 27.2 ng/mL — ABNORMAL LOW (ref 30.0–100.0)

## 2024-10-01 LAB — HEPATITIS B SURFACE ANTIBODY,QUALITATIVE: Hep B Surface Ab, Qual: REACTIVE

## 2024-10-02 ENCOUNTER — Ambulatory Visit: Payer: Self-pay | Admitting: Family Medicine

## 2025-03-12 ENCOUNTER — Encounter: Admitting: Family Medicine
